# Patient Record
Sex: Female | Born: 1945
Health system: Southern US, Community
[De-identification: ages and names within clinical notes are randomized; demographics above are authoritative.]

---

## 2005-01-20 ENCOUNTER — Ambulatory Visit: Payer: Self-pay | Admitting: Pain Medicine

## 2005-01-26 ENCOUNTER — Ambulatory Visit: Payer: Self-pay | Admitting: Pain Medicine

## 2005-02-09 ENCOUNTER — Ambulatory Visit: Payer: Self-pay | Admitting: Physician Assistant

## 2005-12-01 ENCOUNTER — Ambulatory Visit: Payer: Self-pay | Admitting: Internal Medicine

## 2005-12-07 ENCOUNTER — Ambulatory Visit: Payer: Self-pay | Admitting: Internal Medicine

## 2005-12-10 ENCOUNTER — Ambulatory Visit: Payer: Self-pay | Admitting: Oncology

## 2005-12-30 ENCOUNTER — Ambulatory Visit: Payer: Self-pay | Admitting: Internal Medicine

## 2006-12-31 ENCOUNTER — Ambulatory Visit: Payer: Self-pay | Admitting: Oncology

## 2007-01-10 ENCOUNTER — Ambulatory Visit: Payer: Self-pay | Admitting: Internal Medicine

## 2007-01-12 ENCOUNTER — Ambulatory Visit: Payer: Self-pay | Admitting: Internal Medicine

## 2007-01-13 ENCOUNTER — Ambulatory Visit: Payer: Self-pay | Admitting: Oncology

## 2007-01-31 ENCOUNTER — Ambulatory Visit: Payer: Self-pay | Admitting: Oncology

## 2007-11-01 IMAGING — CT CT CHEST-ABD-PELV W/O CM
1 of 2 series · 14 of 29 positions shown, 19 images · non-contrast
Comparison: none

REASON FOR EXAM: F/U hx of cancer of chest, abd, pelv  NO IV CONTRAST due
to renal issues/Gumer...
COMMENTS:

PROCEDURE:     CT  - CT CHEST ABDOMEN AND PELVIS WO  - December 01, 2005  [DATE]
RESULT:
HISTORY: Malignancy.

[Series 2: soft tissue · axial · 0.91mm/px · z∈[+334,+874]mm · 14 of 122 slices shown, 19 images]
[im 7/122  mediastinal]
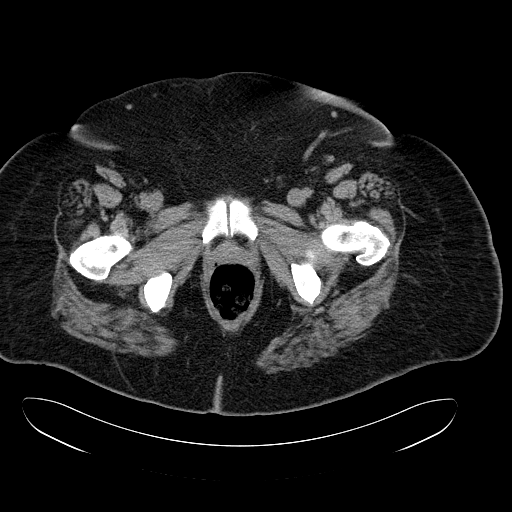
[im 7/122  bone]
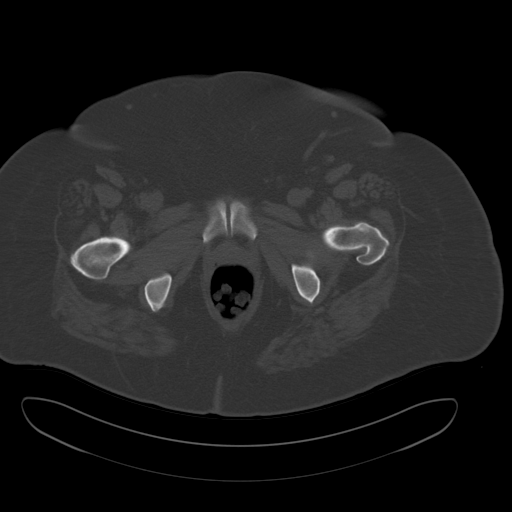
[im 21/122  mediastinal]
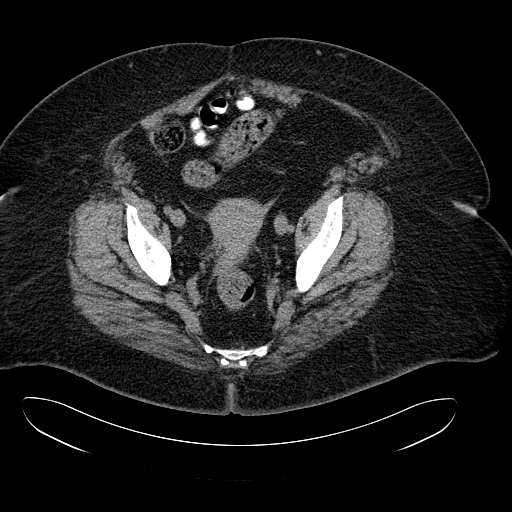
[im 27/122  mediastinal]
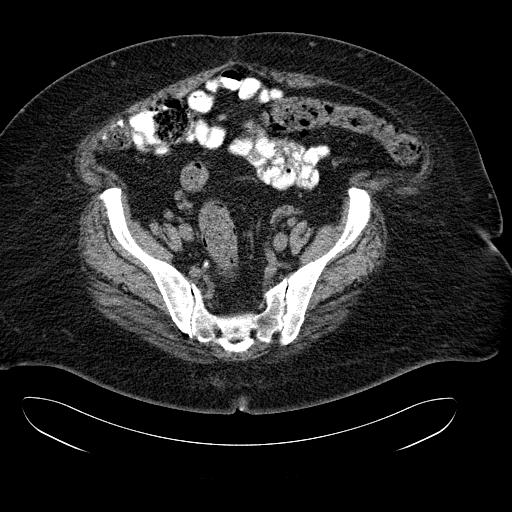
[im 34/122  mediastinal]
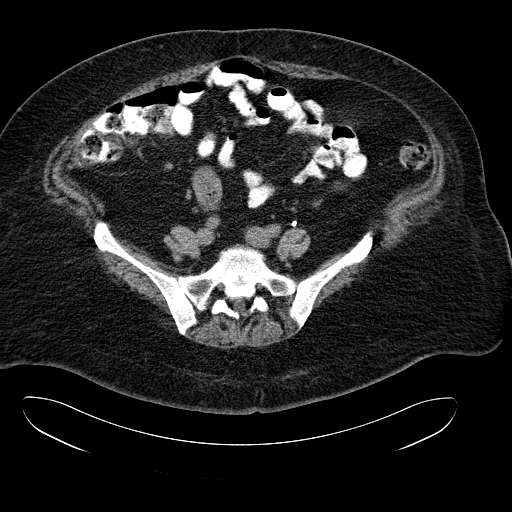
[im 48/122  mediastinal]
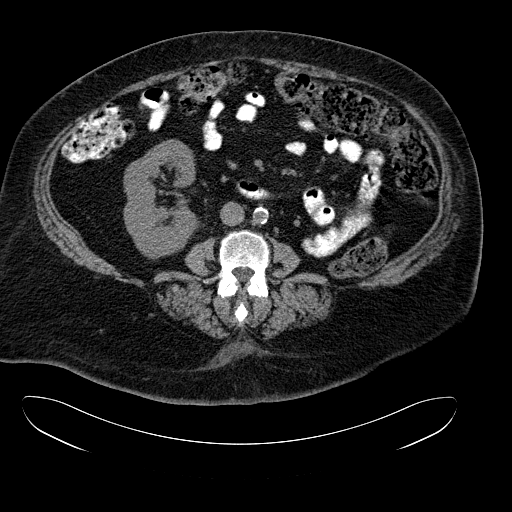
[im 54/122  mediastinal]
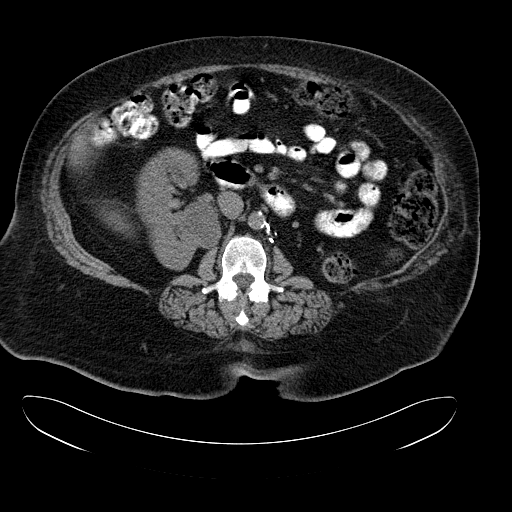
[im 61/122  mediastinal]
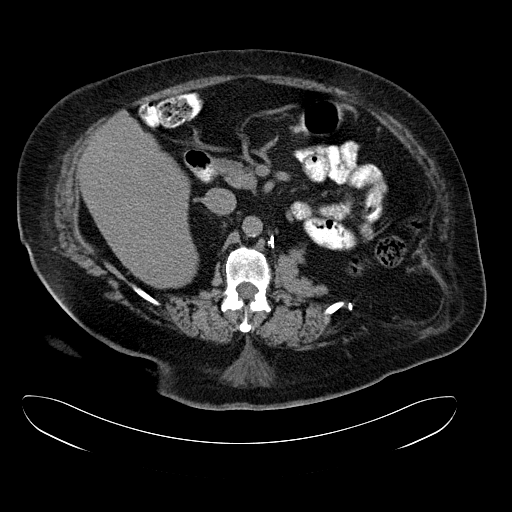
[im 68/122  mediastinal]
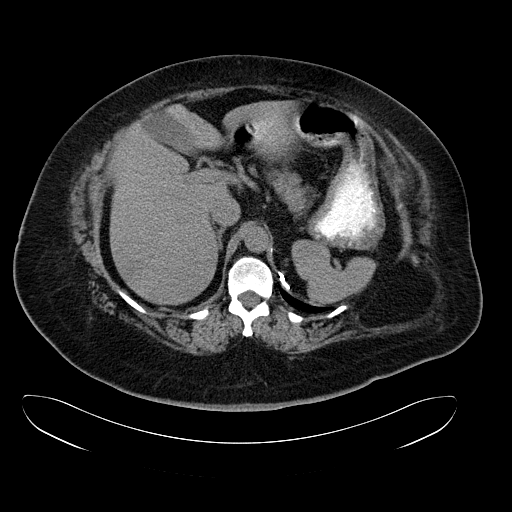
[im 74/122  mediastinal]
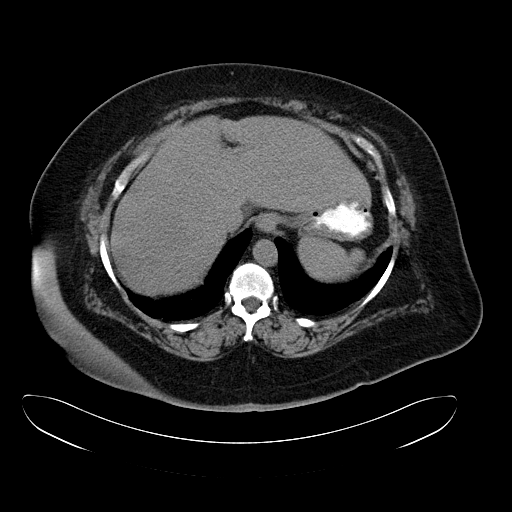
[im 74/122  bone]
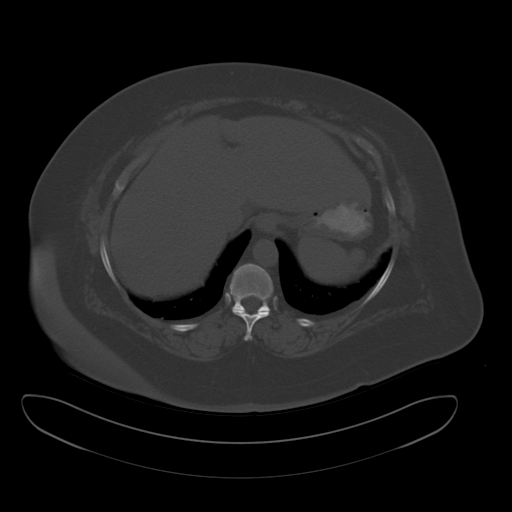
[im 88/122  mediastinal]
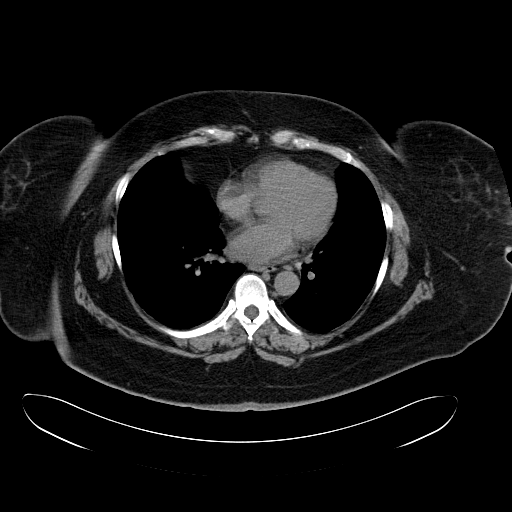
[im 95/122  mediastinal]
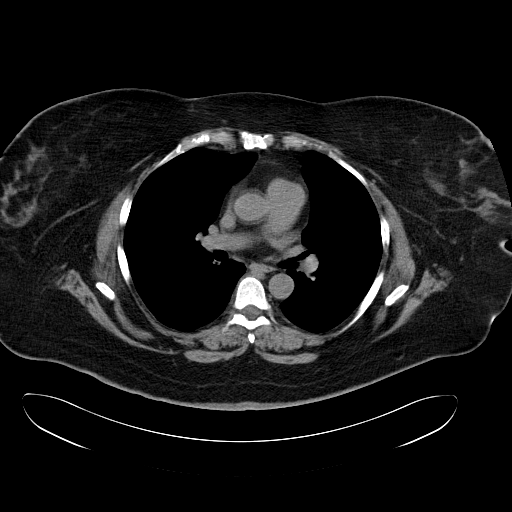
[im 95/122  lung]
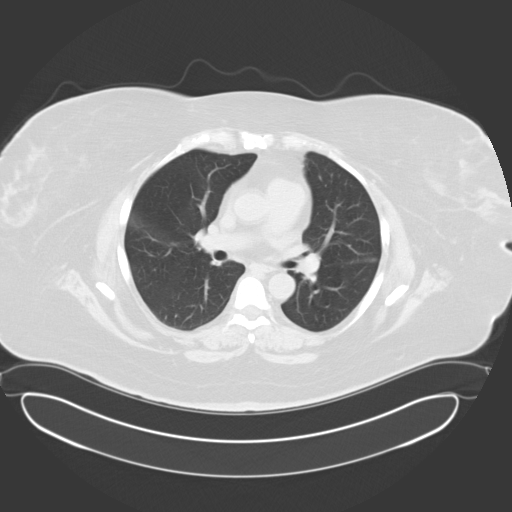
[im 101/122  mediastinal]
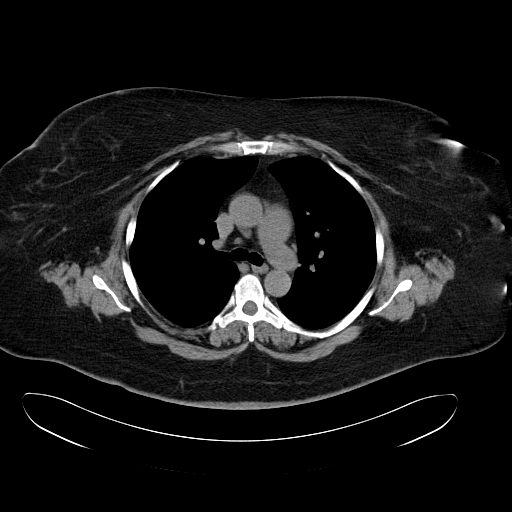
[im 101/122  lung]
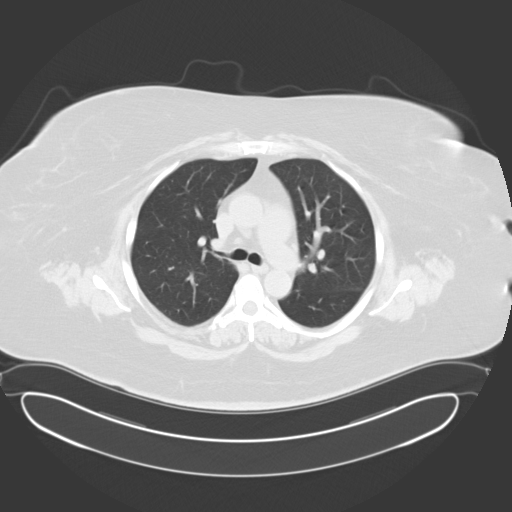
[im 108/122  lung]
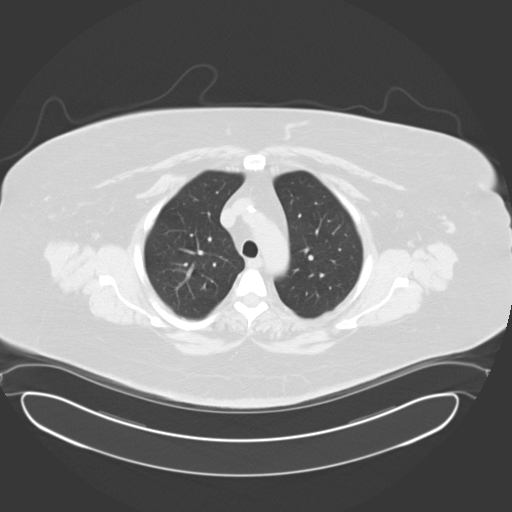
[im 115/122  mediastinal]
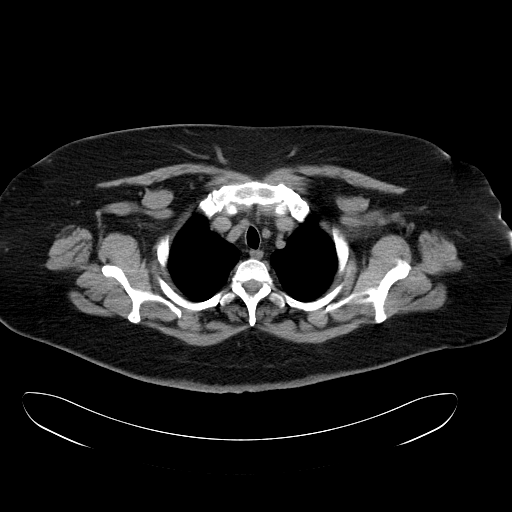
[im 115/122  lung]
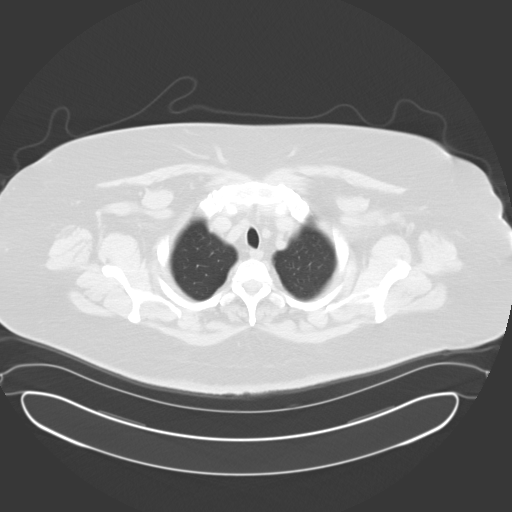

[14 of 29 positions shown; findings below may reference images not displayed]

COMPARISON STUDIES: None.

PROCEDURE AND FINDINGS: Standard non-enhanced CT of the chest, abdomen and
pelvis is obtained. The mediastinum is unremarkable.  Heart size is normal.
Coronary artery calcification is present. The chest wall is unremarkable.
The liver and spleen are normal. The gallbladder is non-distended. The
pancreas is normal. The LEFT kidney is absent. The patient appears to have
had a prior LEFT nephrectomy. The LEFT renal bed is unremarkable. No
evidence of mass lesion. The tail of the pancreas has fallen into the LEFT
renal bed. There is a 3.8 cm mass in the medial portion of the RIGHT kidney.
Although this could represent a cyst, CT attenuation numbers are in the low
20s.  A hypodense solid lesion cannot be excluded and renal ultrasound is
suggested. The RIGHT kidney has hypertrophied. Also bothersome is RIGHT
renal artery atherosclerotic vascular calcification. Given the solitary
kidney this needs to be closely followed.  The appendix is normal. The
pelvis is unremarkable. No inguinal adenopathy is noted.
IMPRESSION: 1)The patient has had a LEFT nephrectomy.

2)Large RIGHT renal mass in the medial portion of the RIGHT kidney. This is
most likely a cyst, however, Hounsfield units are in the low 20s and
therefore renal ultrasound will be necessary to exclude a solid hypodense
lesion.

3)Also worrisome is RIGHT renal artery atherosclerotic vascular
calcification. The patient should be followed carefully for renal vascular
disease given that this RIGHT kidney is a solitary kidney.

4)Coronary artery disease.

## 2008-01-15 ENCOUNTER — Ambulatory Visit: Payer: Self-pay | Admitting: Internal Medicine

## 2009-05-01 ENCOUNTER — Ambulatory Visit: Payer: Self-pay | Admitting: Internal Medicine

## 2010-12-08 ENCOUNTER — Ambulatory Visit: Payer: Self-pay | Admitting: Internal Medicine

## 2012-04-19 ENCOUNTER — Ambulatory Visit: Payer: Self-pay | Admitting: Internal Medicine

## 2012-04-21 ENCOUNTER — Ambulatory Visit: Payer: Self-pay | Admitting: Internal Medicine

## 2013-04-03 ENCOUNTER — Emergency Department: Payer: Self-pay | Admitting: Emergency Medicine

## 2013-04-03 LAB — URINALYSIS, COMPLETE
Bilirubin,UR: NEGATIVE
Ketone: NEGATIVE
Nitrite: NEGATIVE
Ph: 6 (ref 4.5–8.0)
Protein: 100
RBC,UR: 82 /HPF (ref 0–5)
Specific Gravity: 1.012 (ref 1.003–1.030)
Squamous Epithelial: 4

## 2013-10-20 ENCOUNTER — Ambulatory Visit: Payer: Self-pay | Admitting: Internal Medicine

## 2013-10-20 LAB — CBC WITH DIFFERENTIAL/PLATELET
Basophil #: 0.1 10*3/uL (ref 0.0–0.1)
Eosinophil #: 0.1 10*3/uL (ref 0.0–0.7)
Eosinophil %: 1.3 %
Lymphocyte #: 2.6 10*3/uL (ref 1.0–3.6)
MCH: 29 pg (ref 26.0–34.0)
Monocyte %: 5.5 %
Neutrophil #: 6.6 10*3/uL — ABNORMAL HIGH (ref 1.4–6.5)
Neutrophil %: 65.6 %
RBC: 4.61 10*6/uL (ref 3.80–5.20)
WBC: 10 10*3/uL (ref 3.6–11.0)

## 2013-10-20 LAB — COMPREHENSIVE METABOLIC PANEL
Albumin: 3.1 g/dL — ABNORMAL LOW (ref 3.4–5.0)
BUN: 20 mg/dL — ABNORMAL HIGH (ref 7–18)
Chloride: 105 mmol/L (ref 98–107)
Co2: 23 mmol/L (ref 21–32)
Creatinine: 1.53 mg/dL — ABNORMAL HIGH (ref 0.60–1.30)
EGFR (African American): 40 — ABNORMAL LOW
Osmolality: 273 (ref 275–301)
Potassium: 4.8 mmol/L (ref 3.5–5.1)
SGOT(AST): 24 U/L (ref 15–37)
SGPT (ALT): 19 U/L (ref 12–78)
Sodium: 135 mmol/L — ABNORMAL LOW (ref 136–145)

## 2013-10-20 LAB — URINALYSIS, COMPLETE
Bacteria: NONE SEEN
Blood: NEGATIVE
Glucose,UR: NEGATIVE mg/dL (ref 0–75)
Ketone: NEGATIVE
Leukocyte Esterase: NEGATIVE
Nitrite: NEGATIVE
Protein: NEGATIVE
RBC,UR: <1 /HPF (ref 0–5)
Squamous Epithelial: 3

## 2013-10-21 LAB — COMPREHENSIVE METABOLIC PANEL
Albumin: 3 g/dL — ABNORMAL LOW (ref 3.4–5.0)
Alkaline Phosphatase: 79 U/L
BUN: 21 mg/dL — ABNORMAL HIGH (ref 7–18)
Bilirubin,Total: 0.2 mg/dL (ref 0.2–1.0)
Chloride: 102 mmol/L (ref 98–107)
Creatinine: 2.14 mg/dL — ABNORMAL HIGH (ref 0.60–1.30)
EGFR (African American): 27 — ABNORMAL LOW
Glucose: 116 mg/dL — ABNORMAL HIGH (ref 65–99)
Potassium: 4.5 mmol/L (ref 3.5–5.1)
Sodium: 139 mmol/L (ref 136–145)
Total Protein: 6.6 g/dL (ref 6.4–8.2)

## 2013-10-22 ENCOUNTER — Inpatient Hospital Stay: Payer: Self-pay | Admitting: Internal Medicine

## 2013-10-22 LAB — BASIC METABOLIC PANEL
BUN: 24 mg/dL — ABNORMAL HIGH (ref 7–18)
Calcium, Total: 9.4 mg/dL (ref 8.5–10.1)
Chloride: 102 mmol/L (ref 98–107)
Glucose: 128 mg/dL — ABNORMAL HIGH (ref 65–99)
Sodium: 135 mmol/L — ABNORMAL LOW (ref 136–145)

## 2013-11-01 ENCOUNTER — Ambulatory Visit: Payer: Self-pay | Admitting: Internal Medicine

## 2013-12-23 ENCOUNTER — Emergency Department: Payer: Self-pay | Admitting: Internal Medicine

## 2013-12-23 LAB — CBC
HCT: 24.3 % — AB (ref 35.0–47.0)
HGB: 8.2 g/dL — ABNORMAL LOW (ref 12.0–16.0)
MCH: 31.2 pg (ref 26.0–34.0)
MCHC: 33.8 g/dL (ref 32.0–36.0)
MCV: 93 fL (ref 80–100)
PLATELETS: 382 10*3/uL (ref 150–440)
RBC: 2.63 10*6/uL — ABNORMAL LOW (ref 3.80–5.20)
RDW: 17.2 % — AB (ref 11.5–14.5)
WBC: 18.2 10*3/uL — ABNORMAL HIGH (ref 3.6–11.0)

## 2013-12-23 LAB — URINALYSIS, COMPLETE
BILIRUBIN, UR: NEGATIVE
GLUCOSE, UR: NEGATIVE mg/dL (ref 0–75)
Ketone: NEGATIVE
Nitrite: NEGATIVE
Ph: 8 (ref 4.5–8.0)
Protein: 100
Specific Gravity: 1.009 (ref 1.003–1.030)
Squamous Epithelial: NONE SEEN
WBC UR: 2996 /HPF (ref 0–5)

## 2013-12-23 LAB — COMPREHENSIVE METABOLIC PANEL
ALK PHOS: 85 U/L
ANION GAP: 6 — AB (ref 7–16)
AST: 33 U/L (ref 15–37)
Albumin: 2 g/dL — ABNORMAL LOW (ref 3.4–5.0)
BUN: 65 mg/dL — ABNORMAL HIGH (ref 7–18)
Bilirubin,Total: 0.3 mg/dL (ref 0.2–1.0)
CALCIUM: 8.3 mg/dL — AB (ref 8.5–10.1)
CREATININE: 5.28 mg/dL — AB (ref 0.60–1.30)
Chloride: 104 mmol/L (ref 98–107)
Co2: 25 mmol/L (ref 21–32)
EGFR (African American): 9 — ABNORMAL LOW
EGFR (Non-African Amer.): 8 — ABNORMAL LOW
Glucose: 93 mg/dL (ref 65–99)
OSMOLALITY: 288 (ref 275–301)
POTASSIUM: 4.9 mmol/L (ref 3.5–5.1)
SGPT (ALT): 21 U/L (ref 12–78)
Sodium: 135 mmol/L — ABNORMAL LOW (ref 136–145)
Total Protein: 5.4 g/dL — ABNORMAL LOW (ref 6.4–8.2)

## 2013-12-25 LAB — URINE CULTURE

## 2013-12-28 LAB — WOUND CULTURE

## 2013-12-28 LAB — CULTURE, BLOOD (SINGLE)

## 2014-01-13 ENCOUNTER — Inpatient Hospital Stay: Payer: Self-pay | Admitting: Internal Medicine

## 2014-01-13 LAB — CBC
HCT: 25.4 % — ABNORMAL LOW (ref 35.0–47.0)
HGB: 8.4 g/dL — ABNORMAL LOW (ref 12.0–16.0)
MCH: 31.4 pg (ref 26.0–34.0)
MCHC: 33.2 g/dL (ref 32.0–36.0)
MCV: 95 fL (ref 80–100)
Platelet: 227 10*3/uL (ref 150–440)
RBC: 2.68 10*6/uL — ABNORMAL LOW (ref 3.80–5.20)
RDW: 18.5 % — ABNORMAL HIGH (ref 11.5–14.5)
WBC: 8.4 10*3/uL (ref 3.6–11.0)

## 2014-01-13 LAB — COMPREHENSIVE METABOLIC PANEL
ALBUMIN: 2 g/dL — AB (ref 3.4–5.0)
Alkaline Phosphatase: 64 U/L
Anion Gap: 3 — ABNORMAL LOW (ref 7–16)
BUN: 13 mg/dL (ref 7–18)
Bilirubin,Total: 0.3 mg/dL (ref 0.2–1.0)
CALCIUM: 8.1 mg/dL — AB (ref 8.5–10.1)
Chloride: 99 mmol/L (ref 98–107)
Co2: 36 mmol/L — ABNORMAL HIGH (ref 21–32)
Creatinine: 2.89 mg/dL — ABNORMAL HIGH (ref 0.60–1.30)
EGFR (African American): 18 — ABNORMAL LOW
EGFR (Non-African Amer.): 16 — ABNORMAL LOW
Glucose: 83 mg/dL (ref 65–99)
Osmolality: 275 (ref 275–301)
Potassium: 3.1 mmol/L — ABNORMAL LOW (ref 3.5–5.1)
SGOT(AST): 19 U/L (ref 15–37)
SGPT (ALT): 20 U/L (ref 12–78)
Sodium: 138 mmol/L (ref 136–145)
Total Protein: 5.6 g/dL — ABNORMAL LOW (ref 6.4–8.2)

## 2014-01-13 LAB — TROPONIN I
TROPONIN-I: 0.18 ng/mL — AB
Troponin-I: 0.17 ng/mL — ABNORMAL HIGH
Troponin-I: 0.14 ng/mL — ABNORMAL HIGH

## 2014-01-13 LAB — PROTIME-INR
INR: 1.5
Prothrombin Time: 17.7 secs — ABNORMAL HIGH (ref 11.5–14.7)

## 2014-01-14 LAB — BASIC METABOLIC PANEL
ANION GAP: 5 — AB (ref 7–16)
BUN: 23 mg/dL — ABNORMAL HIGH (ref 7–18)
CALCIUM: 7.9 mg/dL — AB (ref 8.5–10.1)
CHLORIDE: 97 mmol/L — AB (ref 98–107)
Co2: 31 mmol/L (ref 21–32)
Creatinine: 3.82 mg/dL — ABNORMAL HIGH (ref 0.60–1.30)
EGFR (African American): 13 — ABNORMAL LOW
EGFR (Non-African Amer.): 11 — ABNORMAL LOW
Glucose: 140 mg/dL — ABNORMAL HIGH (ref 65–99)
Osmolality: 272 (ref 275–301)
POTASSIUM: 3.9 mmol/L (ref 3.5–5.1)
SODIUM: 133 mmol/L — AB (ref 136–145)

## 2014-01-14 LAB — CBC WITH DIFFERENTIAL/PLATELET
BASOS ABS: 0 10*3/uL (ref 0.0–0.1)
BASOS PCT: 0.5 %
EOS ABS: 0 10*3/uL (ref 0.0–0.7)
Eosinophil %: 0 %
HCT: 24 % — AB (ref 35.0–47.0)
HGB: 7.9 g/dL — ABNORMAL LOW (ref 12.0–16.0)
Lymphocyte #: 1.7 10*3/uL (ref 1.0–3.6)
Lymphocyte %: 26.5 %
MCH: 30.8 pg (ref 26.0–34.0)
MCHC: 32.8 g/dL (ref 32.0–36.0)
MCV: 94 fL (ref 80–100)
MONO ABS: 0.1 x10 3/mm — AB (ref 0.2–0.9)
Monocyte %: 2.3 %
Neutrophil #: 4.5 10*3/uL (ref 1.4–6.5)
Neutrophil %: 70.7 %
Platelet: 224 10*3/uL (ref 150–440)
RBC: 2.55 10*6/uL — AB (ref 3.80–5.20)
RDW: 17.4 % — ABNORMAL HIGH (ref 11.5–14.5)
WBC: 6.4 10*3/uL (ref 3.6–11.0)

## 2014-01-14 LAB — PROTIME-INR
INR: 1.6
PROTHROMBIN TIME: 18.5 s — AB (ref 11.5–14.7)

## 2014-01-15 LAB — CBC WITH DIFFERENTIAL/PLATELET
Basophil #: 0 10*3/uL (ref 0.0–0.1)
Basophil %: 0.2 %
EOS ABS: 0 10*3/uL (ref 0.0–0.7)
Eosinophil %: 0 %
HCT: 22.7 % — ABNORMAL LOW (ref 35.0–47.0)
HGB: 7.3 g/dL — ABNORMAL LOW (ref 12.0–16.0)
Lymphocyte #: 1.7 10*3/uL (ref 1.0–3.6)
Lymphocyte %: 16.3 %
MCH: 29.8 pg (ref 26.0–34.0)
MCHC: 32.1 g/dL (ref 32.0–36.0)
MCV: 93 fL (ref 80–100)
Monocyte #: 0.3 x10 3/mm (ref 0.2–0.9)
Monocyte %: 3.1 %
Neutrophil #: 8.4 10*3/uL — ABNORMAL HIGH (ref 1.4–6.5)
Neutrophil %: 80.4 %
Platelet: 247 10*3/uL (ref 150–440)
RBC: 2.44 10*6/uL — ABNORMAL LOW (ref 3.80–5.20)
RDW: 17.6 % — ABNORMAL HIGH (ref 11.5–14.5)
WBC: 10.4 10*3/uL (ref 3.6–11.0)

## 2014-01-15 LAB — URINALYSIS, COMPLETE
Bilirubin,UR: NEGATIVE
Glucose,UR: 50 mg/dL (ref 0–75)
KETONE: NEGATIVE
NITRITE: NEGATIVE
Ph: 8 (ref 4.5–8.0)
Protein: 100
RBC,UR: 102 /HPF (ref 0–5)
Specific Gravity: 1.005 (ref 1.003–1.030)
Squamous Epithelial: NONE SEEN
WBC UR: 334 /HPF (ref 0–5)

## 2014-01-15 LAB — PHOSPHORUS: Phosphorus: 4.9 mg/dL (ref 2.5–4.9)

## 2014-01-15 LAB — PROTIME-INR
INR: 2.4
Prothrombin Time: 25.9 secs — ABNORMAL HIGH (ref 11.5–14.7)

## 2014-01-15 LAB — BASIC METABOLIC PANEL
Anion Gap: 7 (ref 7–16)
BUN: 35 mg/dL — ABNORMAL HIGH (ref 7–18)
CREATININE: 4.42 mg/dL — AB (ref 0.60–1.30)
Calcium, Total: 7.9 mg/dL — ABNORMAL LOW (ref 8.5–10.1)
Chloride: 97 mmol/L — ABNORMAL LOW (ref 98–107)
Co2: 30 mmol/L (ref 21–32)
EGFR (Non-African Amer.): 10 — ABNORMAL LOW
GFR CALC AF AMER: 11 — AB
Glucose: 148 mg/dL — ABNORMAL HIGH (ref 65–99)
OSMOLALITY: 279 (ref 275–301)
Potassium: 3.7 mmol/L (ref 3.5–5.1)
SODIUM: 134 mmol/L — AB (ref 136–145)

## 2014-01-16 LAB — CBC WITH DIFFERENTIAL/PLATELET
Basophil #: 0 10*3/uL (ref 0.0–0.1)
Basophil %: 0.1 %
EOS PCT: 0 %
Eosinophil #: 0 10*3/uL (ref 0.0–0.7)
HCT: 23.1 % — ABNORMAL LOW (ref 35.0–47.0)
HGB: 7.6 g/dL — ABNORMAL LOW (ref 12.0–16.0)
Lymphocyte #: 1.5 10*3/uL (ref 1.0–3.6)
Lymphocyte %: 21.8 %
MCH: 30.9 pg (ref 26.0–34.0)
MCHC: 33 g/dL (ref 32.0–36.0)
MCV: 94 fL (ref 80–100)
Monocyte #: 0.4 x10 3/mm (ref 0.2–0.9)
Monocyte %: 5.9 %
Neutrophil #: 5 10*3/uL (ref 1.4–6.5)
Neutrophil %: 72.2 %
Platelet: 274 10*3/uL (ref 150–440)
RBC: 2.46 10*6/uL — ABNORMAL LOW (ref 3.80–5.20)
RDW: 17.8 % — AB (ref 11.5–14.5)
WBC: 6.9 10*3/uL (ref 3.6–11.0)

## 2014-01-16 LAB — BASIC METABOLIC PANEL
Anion Gap: 4 — ABNORMAL LOW (ref 7–16)
BUN: 18 mg/dL (ref 7–18)
Calcium, Total: 8 mg/dL — ABNORMAL LOW (ref 8.5–10.1)
Chloride: 99 mmol/L (ref 98–107)
Co2: 33 mmol/L — ABNORMAL HIGH (ref 21–32)
Creatinine: 2.37 mg/dL — ABNORMAL HIGH (ref 0.60–1.30)
EGFR (African American): 23 — ABNORMAL LOW
GFR CALC NON AF AMER: 20 — AB
Glucose: 114 mg/dL — ABNORMAL HIGH (ref 65–99)
Osmolality: 275 (ref 275–301)
POTASSIUM: 3.9 mmol/L (ref 3.5–5.1)
Sodium: 136 mmol/L (ref 136–145)

## 2014-01-16 LAB — PROTIME-INR
INR: 2.5
Prothrombin Time: 26.4 secs — ABNORMAL HIGH (ref 11.5–14.7)

## 2014-01-17 LAB — BASIC METABOLIC PANEL
ANION GAP: 5 — AB (ref 7–16)
BUN: 30 mg/dL — AB (ref 7–18)
CHLORIDE: 101 mmol/L (ref 98–107)
CO2: 32 mmol/L (ref 21–32)
CREATININE: 3.14 mg/dL — AB (ref 0.60–1.30)
Calcium, Total: 8.2 mg/dL — ABNORMAL LOW (ref 8.5–10.1)
EGFR (African American): 17 — ABNORMAL LOW
EGFR (Non-African Amer.): 14 — ABNORMAL LOW
Glucose: 101 mg/dL — ABNORMAL HIGH (ref 65–99)
Osmolality: 282 (ref 275–301)
POTASSIUM: 3.8 mmol/L (ref 3.5–5.1)
SODIUM: 138 mmol/L (ref 136–145)

## 2014-01-17 LAB — CBC WITH DIFFERENTIAL/PLATELET
BASOS PCT: 0.2 %
Basophil #: 0 10*3/uL (ref 0.0–0.1)
EOS ABS: 0 10*3/uL (ref 0.0–0.7)
EOS PCT: 0 %
HCT: 24.9 % — AB (ref 35.0–47.0)
HGB: 7.8 g/dL — ABNORMAL LOW (ref 12.0–16.0)
LYMPHS ABS: 2.3 10*3/uL (ref 1.0–3.6)
Lymphocyte %: 30 %
MCH: 29.3 pg (ref 26.0–34.0)
MCHC: 31.3 g/dL — ABNORMAL LOW (ref 32.0–36.0)
MCV: 94 fL (ref 80–100)
MONO ABS: 0.8 x10 3/mm (ref 0.2–0.9)
Monocyte %: 10.1 %
NEUTROS PCT: 59.7 %
Neutrophil #: 4.5 10*3/uL (ref 1.4–6.5)
Platelet: 317 10*3/uL (ref 150–440)
RBC: 2.66 10*6/uL — AB (ref 3.80–5.20)
RDW: 17.4 % — AB (ref 11.5–14.5)
WBC: 7.5 10*3/uL (ref 3.6–11.0)

## 2014-01-17 LAB — PHOSPHORUS: Phosphorus: 3.7 mg/dL (ref 2.5–4.9)

## 2014-01-17 LAB — PROTIME-INR
INR: 2.3
Prothrombin Time: 24.5 secs — ABNORMAL HIGH (ref 11.5–14.7)

## 2014-01-17 LAB — URINE CULTURE

## 2014-01-18 ENCOUNTER — Other Ambulatory Visit (HOSPITAL_COMMUNITY): Payer: Self-pay

## 2014-01-18 ENCOUNTER — Ambulatory Visit (HOSPITAL_COMMUNITY)
Admission: AD | Admit: 2014-01-18 | Discharge: 2014-01-18 | Disposition: A | Discharge status: Home / Self Care | Payer: Medicare Other | Source: Other Acute Inpatient Hospital | Attending: Internal Medicine | Admitting: Internal Medicine

## 2014-01-18 ENCOUNTER — Inpatient Hospital Stay
Admission: AD | Admit: 2014-01-18 | Discharge: 2014-02-06 | Disposition: A | Discharge status: Other Acute Inpatient Hospital | Source: Ambulatory Visit | Attending: Internal Medicine | Admitting: Internal Medicine

## 2014-01-18 DIAGNOSIS — A419 Sepsis, unspecified organism: Secondary | ICD-10-CM | POA: Insufficient documentation

## 2014-01-18 LAB — CULTURE, BLOOD (SINGLE)

## 2014-01-18 LAB — PROTIME-INR
INR: 2.3
Prothrombin Time: 24.7 secs — ABNORMAL HIGH (ref 11.5–14.7)

## 2014-01-19 ENCOUNTER — Institutional Professional Consult (permissible substitution) (HOSPITAL_COMMUNITY)

## 2014-01-19 LAB — T4, FREE: FREE T4: 1.75 ng/dL (ref 0.80–1.80)

## 2014-01-19 LAB — URINALYSIS, ROUTINE W REFLEX MICROSCOPIC
Bilirubin Urine: NEGATIVE
Glucose, UA: NEGATIVE mg/dL
Ketones, ur: NEGATIVE mg/dL
NITRITE: NEGATIVE
PH: 7.5 (ref 5.0–8.0)
Protein, ur: >300 mg/dL — AB
SPECIFIC GRAVITY, URINE: 1.012 (ref 1.005–1.030)
UROBILINOGEN UA: 0.2 mg/dL (ref 0.0–1.0)

## 2014-01-19 LAB — PROTIME-INR
INR: 1.96 — ABNORMAL HIGH (ref 0.00–1.49)
Prothrombin Time: 21.7 seconds — ABNORMAL HIGH (ref 11.6–15.2)

## 2014-01-19 LAB — CBC WITH DIFFERENTIAL/PLATELET
BASOS ABS: 0 10*3/uL (ref 0.0–0.1)
Basophils Relative: 0 % (ref 0–1)
Eosinophils Absolute: 0.2 10*3/uL (ref 0.0–0.7)
Eosinophils Relative: 2 % (ref 0–5)
HEMATOCRIT: 26.5 % — AB (ref 36.0–46.0)
Hemoglobin: 8.4 g/dL — ABNORMAL LOW (ref 12.0–15.0)
LYMPHS PCT: 37 % (ref 12–46)
Lymphs Abs: 3.5 10*3/uL (ref 0.7–4.0)
MCH: 30.5 pg (ref 26.0–34.0)
MCHC: 31.7 g/dL (ref 30.0–36.0)
MCV: 96.4 fL (ref 78.0–100.0)
MONO ABS: 0.6 10*3/uL (ref 0.1–1.0)
Monocytes Relative: 6 % (ref 3–12)
NEUTROS ABS: 5.2 10*3/uL (ref 1.7–7.7)
NEUTROS PCT: 55 % (ref 43–77)
Platelets: 331 10*3/uL (ref 150–400)
RBC: 2.75 MIL/uL — ABNORMAL LOW (ref 3.87–5.11)
RDW: 17.3 % — AB (ref 11.5–15.5)
WBC: 9.5 10*3/uL (ref 4.0–10.5)

## 2014-01-19 LAB — COMPREHENSIVE METABOLIC PANEL
ALK PHOS: 58 U/L (ref 39–117)
ALT: 16 U/L (ref 0–35)
AST: 17 U/L (ref 0–37)
Albumin: 2.1 g/dL — ABNORMAL LOW (ref 3.5–5.2)
BUN: 31 mg/dL — ABNORMAL HIGH (ref 6–23)
CHLORIDE: 98 meq/L (ref 96–112)
CO2: 33 meq/L — AB (ref 19–32)
CREATININE: 3.3 mg/dL — AB (ref 0.50–1.10)
Calcium: 8.7 mg/dL (ref 8.4–10.5)
GFR calc Af Amer: 15 mL/min — ABNORMAL LOW (ref >90)
GFR, EST NON AFRICAN AMERICAN: 13 mL/min — AB (ref >90)
Glucose, Bld: 106 mg/dL — ABNORMAL HIGH (ref 70–99)
POTASSIUM: 3.9 meq/L (ref 3.7–5.3)
Sodium: 142 mEq/L (ref 137–147)
Total Protein: 5.1 g/dL — ABNORMAL LOW (ref 6.0–8.3)

## 2014-01-19 LAB — URINE MICROSCOPIC-ADD ON

## 2014-01-19 LAB — VANCOMYCIN, RANDOM: VANCOMYCIN RM: 11.4 ug/mL

## 2014-01-19 LAB — MAGNESIUM: Magnesium: 1.5 mg/dL (ref 1.5–2.5)

## 2014-01-19 LAB — HEPATITIS B SURFACE ANTIBODY,QUALITATIVE: Hep B S Ab: NEGATIVE

## 2014-01-19 LAB — TSH: TSH: 3.813 u[IU]/mL (ref 0.350–4.500)

## 2014-01-19 LAB — VITAMIN B12: Vitamin B-12: 327 pg/mL (ref 211–911)

## 2014-01-19 LAB — PHOSPHORUS: PHOSPHORUS: 4 mg/dL (ref 2.3–4.6)

## 2014-01-19 LAB — PREALBUMIN: Prealbumin: 26.1 mg/dL (ref 17.0–34.0)

## 2014-01-19 LAB — HEPATITIS B SURFACE ANTIGEN: HEP B S AG: NEGATIVE

## 2014-01-20 LAB — CBC
HCT: 25.3 % — ABNORMAL LOW (ref 36.0–46.0)
Hemoglobin: 8.1 g/dL — ABNORMAL LOW (ref 12.0–15.0)
MCH: 30.5 pg (ref 26.0–34.0)
MCHC: 32 g/dL (ref 30.0–36.0)
MCV: 95.1 fL (ref 78.0–100.0)
Platelets: 311 10*3/uL (ref 150–400)
RBC: 2.66 MIL/uL — ABNORMAL LOW (ref 3.87–5.11)
RDW: 17.2 % — AB (ref 11.5–15.5)
WBC: 9.7 10*3/uL (ref 4.0–10.5)

## 2014-01-20 LAB — BASIC METABOLIC PANEL
BUN: 43 mg/dL — AB (ref 6–23)
CHLORIDE: 98 meq/L (ref 96–112)
CO2: 28 mEq/L (ref 19–32)
Calcium: 8.8 mg/dL (ref 8.4–10.5)
Creatinine, Ser: 4.09 mg/dL — ABNORMAL HIGH (ref 0.50–1.10)
GFR, EST AFRICAN AMERICAN: 12 mL/min — AB (ref >90)
GFR, EST NON AFRICAN AMERICAN: 10 mL/min — AB (ref >90)
Glucose, Bld: 145 mg/dL — ABNORMAL HIGH (ref 70–99)
Potassium: 5.6 mEq/L — ABNORMAL HIGH (ref 3.7–5.3)
Sodium: 139 mEq/L (ref 137–147)

## 2014-01-20 LAB — PROTIME-INR
INR: 1.85 — ABNORMAL HIGH (ref 0.00–1.49)
Prothrombin Time: 20.8 seconds — ABNORMAL HIGH (ref 11.6–15.2)

## 2014-01-20 LAB — URINE CULTURE
COLONY COUNT: NO GROWTH
Culture: NO GROWTH

## 2014-01-20 LAB — FOLATE RBC: RBC FOLATE: 1008 ng/mL — AB (ref >280)

## 2014-01-21 LAB — COMPREHENSIVE METABOLIC PANEL
ALK PHOS: 54 U/L (ref 39–117)
ALT: 16 U/L (ref 0–35)
AST: 16 U/L (ref 0–37)
Albumin: 2.1 g/dL — ABNORMAL LOW (ref 3.5–5.2)
BUN: 65 mg/dL — AB (ref 6–23)
CO2: 28 mEq/L (ref 19–32)
Calcium: 8.8 mg/dL (ref 8.4–10.5)
Chloride: 98 mEq/L (ref 96–112)
Creatinine, Ser: 4.8 mg/dL — ABNORMAL HIGH (ref 0.50–1.10)
GFR calc Af Amer: 10 mL/min — ABNORMAL LOW (ref >90)
GFR calc non Af Amer: 8 mL/min — ABNORMAL LOW (ref >90)
GLUCOSE: 191 mg/dL — AB (ref 70–99)
POTASSIUM: 5.5 meq/L — AB (ref 3.7–5.3)
Sodium: 138 mEq/L (ref 137–147)
TOTAL PROTEIN: 5.3 g/dL — AB (ref 6.0–8.3)

## 2014-01-21 LAB — CULTURE, BLOOD (ROUTINE X 2)

## 2014-01-21 LAB — CBC WITH DIFFERENTIAL/PLATELET
BASOS ABS: 0 10*3/uL (ref 0.0–0.1)
Basophils Relative: 0 % (ref 0–1)
Eosinophils Absolute: 0 10*3/uL (ref 0.0–0.7)
Eosinophils Relative: 0 % (ref 0–5)
HEMATOCRIT: 24.8 % — AB (ref 36.0–46.0)
Hemoglobin: 8.1 g/dL — ABNORMAL LOW (ref 12.0–15.0)
LYMPHS PCT: 13 % (ref 12–46)
Lymphs Abs: 2.2 10*3/uL (ref 0.7–4.0)
MCH: 30.9 pg (ref 26.0–34.0)
MCHC: 32.7 g/dL (ref 30.0–36.0)
MCV: 94.7 fL (ref 78.0–100.0)
Monocytes Absolute: 0.4 10*3/uL (ref 0.1–1.0)
Monocytes Relative: 3 % (ref 3–12)
Neutro Abs: 14 10*3/uL — ABNORMAL HIGH (ref 1.7–7.7)
Neutrophils Relative %: 84 % — ABNORMAL HIGH (ref 43–77)
PLATELETS: 315 10*3/uL (ref 150–400)
RBC: 2.62 MIL/uL — ABNORMAL LOW (ref 3.87–5.11)
RDW: 16.6 % — AB (ref 11.5–15.5)
WBC: 16.6 10*3/uL — AB (ref 4.0–10.5)

## 2014-01-21 LAB — PROTIME-INR
INR: 1.87 — ABNORMAL HIGH (ref 0.00–1.49)
Prothrombin Time: 21 seconds — ABNORMAL HIGH (ref 11.6–15.2)

## 2014-01-22 LAB — BASIC METABOLIC PANEL
BUN: 32 mg/dL — ABNORMAL HIGH (ref 6–23)
CALCIUM: 8.5 mg/dL (ref 8.4–10.5)
CO2: 29 meq/L (ref 19–32)
CREATININE: 2.51 mg/dL — AB (ref 0.50–1.10)
Chloride: 98 mEq/L (ref 96–112)
GFR calc Af Amer: 21 mL/min — ABNORMAL LOW (ref >90)
GFR calc non Af Amer: 18 mL/min — ABNORMAL LOW (ref >90)
Glucose, Bld: 150 mg/dL — ABNORMAL HIGH (ref 70–99)
Potassium: 5.1 mEq/L (ref 3.7–5.3)
SODIUM: 139 meq/L (ref 137–147)

## 2014-01-22 LAB — CBC WITH DIFFERENTIAL/PLATELET
BASOS ABS: 0 10*3/uL (ref 0.0–0.1)
Basophils Relative: 0 % (ref 0–1)
Eosinophils Absolute: 0 10*3/uL (ref 0.0–0.7)
Eosinophils Relative: 0 % (ref 0–5)
HCT: 24.6 % — ABNORMAL LOW (ref 36.0–46.0)
Hemoglobin: 8.1 g/dL — ABNORMAL LOW (ref 12.0–15.0)
LYMPHS ABS: 1.5 10*3/uL (ref 0.7–4.0)
LYMPHS PCT: 11 % — AB (ref 12–46)
MCH: 30.6 pg (ref 26.0–34.0)
MCHC: 32.9 g/dL (ref 30.0–36.0)
MCV: 92.8 fL (ref 78.0–100.0)
Monocytes Absolute: 0.4 10*3/uL (ref 0.1–1.0)
Monocytes Relative: 3 % (ref 3–12)
NEUTROS PCT: 87 % — AB (ref 43–77)
Neutro Abs: 12.5 10*3/uL — ABNORMAL HIGH (ref 1.7–7.7)
PLATELETS: 313 10*3/uL (ref 150–400)
RBC: 2.65 MIL/uL — ABNORMAL LOW (ref 3.87–5.11)
RDW: 16.6 % — ABNORMAL HIGH (ref 11.5–15.5)
WBC: 14.4 10*3/uL — AB (ref 4.0–10.5)

## 2014-01-22 LAB — PROCALCITONIN: Procalcitonin: 0.12 ng/mL

## 2014-01-22 LAB — PROTIME-INR
INR: 2.2 — ABNORMAL HIGH (ref 0.00–1.49)
PROTHROMBIN TIME: 23.7 s — AB (ref 11.6–15.2)

## 2014-01-23 DIAGNOSIS — I369 Nonrheumatic tricuspid valve disorder, unspecified: Secondary | ICD-10-CM

## 2014-01-23 LAB — RENAL FUNCTION PANEL
Albumin: 2.1 g/dL — ABNORMAL LOW (ref 3.5–5.2)
BUN: 68 mg/dL — ABNORMAL HIGH (ref 6–23)
CALCIUM: 8.4 mg/dL (ref 8.4–10.5)
CHLORIDE: 96 meq/L (ref 96–112)
CO2: 27 meq/L (ref 19–32)
CREATININE: 3.49 mg/dL — AB (ref 0.50–1.10)
GFR calc Af Amer: 14 mL/min — ABNORMAL LOW (ref >90)
GFR, EST NON AFRICAN AMERICAN: 12 mL/min — AB (ref >90)
GLUCOSE: 162 mg/dL — AB (ref 70–99)
Phosphorus: 6.6 mg/dL — ABNORMAL HIGH (ref 2.3–4.6)
Potassium: 5.2 mEq/L (ref 3.7–5.3)
Sodium: 137 mEq/L (ref 137–147)

## 2014-01-23 LAB — PROTIME-INR
INR: 2.89 — ABNORMAL HIGH (ref 0.00–1.49)
PROTHROMBIN TIME: 29.2 s — AB (ref 11.6–15.2)

## 2014-01-23 LAB — CBC
HCT: 24.9 % — ABNORMAL LOW (ref 36.0–46.0)
Hemoglobin: 8 g/dL — ABNORMAL LOW (ref 12.0–15.0)
MCH: 29.7 pg (ref 26.0–34.0)
MCHC: 32.1 g/dL (ref 30.0–36.0)
MCV: 92.6 fL (ref 78.0–100.0)
Platelets: 332 10*3/uL (ref 150–400)
RBC: 2.69 MIL/uL — AB (ref 3.87–5.11)
RDW: 16.4 % — ABNORMAL HIGH (ref 11.5–15.5)
WBC: 14 10*3/uL — ABNORMAL HIGH (ref 4.0–10.5)

## 2014-01-23 NOTE — Progress Notes (Signed)
Echocardiogram 2D Echocardiogram has been performed.  [name] 01/23/2014, 12:23 PM

## 2014-01-24 LAB — BASIC METABOLIC PANEL
BUN: 51 mg/dL — ABNORMAL HIGH (ref 6–23)
CO2: 28 mEq/L (ref 19–32)
Calcium: 8.5 mg/dL (ref 8.4–10.5)
Chloride: 94 mEq/L — ABNORMAL LOW (ref 96–112)
Creatinine, Ser: 2.61 mg/dL — ABNORMAL HIGH (ref 0.50–1.10)
GFR calc Af Amer: 20 mL/min — ABNORMAL LOW (ref >90)
GFR, EST NON AFRICAN AMERICAN: 18 mL/min — AB (ref >90)
Glucose, Bld: 141 mg/dL — ABNORMAL HIGH (ref 70–99)
POTASSIUM: 5.5 meq/L — AB (ref 3.7–5.3)
SODIUM: 137 meq/L (ref 137–147)

## 2014-01-24 LAB — PROTIME-INR
INR: 3.46 — AB (ref 0.00–1.49)
Prothrombin Time: 33.5 seconds — ABNORMAL HIGH (ref 11.6–15.2)

## 2014-01-24 LAB — PTH, INTACT AND CALCIUM
CALCIUM TOTAL (PTH): 7.8 mg/dL — AB (ref 8.4–10.5)
PTH: 243.6 pg/mL — ABNORMAL HIGH (ref 14.0–72.0)

## 2014-01-25 ENCOUNTER — Other Ambulatory Visit (HOSPITAL_COMMUNITY)

## 2014-01-25 LAB — CBC WITH DIFFERENTIAL/PLATELET
Basophils Absolute: 0 10*3/uL (ref 0.0–0.1)
Basophils Relative: 0 % (ref 0–1)
EOS ABS: 0 10*3/uL (ref 0.0–0.7)
Eosinophils Relative: 0 % (ref 0–5)
HCT: 25.9 % — ABNORMAL LOW (ref 36.0–46.0)
Hemoglobin: 8.4 g/dL — ABNORMAL LOW (ref 12.0–15.0)
Lymphocytes Relative: 18 % (ref 12–46)
Lymphs Abs: 3.6 10*3/uL (ref 0.7–4.0)
MCH: 30.3 pg (ref 26.0–34.0)
MCHC: 32.4 g/dL (ref 30.0–36.0)
MCV: 93.5 fL (ref 78.0–100.0)
MONOS PCT: 7 % (ref 3–12)
Monocytes Absolute: 1.4 10*3/uL — ABNORMAL HIGH (ref 0.1–1.0)
Neutro Abs: 14.6 10*3/uL — ABNORMAL HIGH (ref 1.7–7.7)
Neutrophils Relative %: 74 % (ref 43–77)
PLATELETS: 307 10*3/uL (ref 150–400)
RBC: 2.77 MIL/uL — ABNORMAL LOW (ref 3.87–5.11)
RDW: 16.6 % — AB (ref 11.5–15.5)
WBC: 19.7 10*3/uL — ABNORMAL HIGH (ref 4.0–10.5)

## 2014-01-25 LAB — CULTURE, BLOOD (ROUTINE X 2)
Culture: NO GROWTH
Culture: NO GROWTH

## 2014-01-25 LAB — RENAL FUNCTION PANEL
ALBUMIN: 2.1 g/dL — AB (ref 3.5–5.2)
BUN: 73 mg/dL — ABNORMAL HIGH (ref 6–23)
CALCIUM: 8.5 mg/dL (ref 8.4–10.5)
CO2: 29 mEq/L (ref 19–32)
CREATININE: 3.47 mg/dL — AB (ref 0.50–1.10)
Chloride: 95 mEq/L — ABNORMAL LOW (ref 96–112)
GFR calc Af Amer: 14 mL/min — ABNORMAL LOW (ref >90)
GFR, EST NON AFRICAN AMERICAN: 12 mL/min — AB (ref >90)
Glucose, Bld: 67 mg/dL — ABNORMAL LOW (ref 70–99)
PHOSPHORUS: 6.4 mg/dL — AB (ref 2.3–4.6)
Potassium: 5.4 mEq/L — ABNORMAL HIGH (ref 3.7–5.3)
SODIUM: 137 meq/L (ref 137–147)

## 2014-01-25 LAB — PROTIME-INR
INR: 3.35 — AB (ref 0.00–1.49)
PROTHROMBIN TIME: 32.7 s — AB (ref 11.6–15.2)

## 2014-01-26 ENCOUNTER — Other Ambulatory Visit (HOSPITAL_COMMUNITY)

## 2014-01-26 LAB — CBC WITH DIFFERENTIAL/PLATELET
Basophils Absolute: 0 10*3/uL (ref 0.0–0.1)
Basophils Relative: 0 % (ref 0–1)
Eosinophils Absolute: 0 10*3/uL (ref 0.0–0.7)
Eosinophils Relative: 0 % (ref 0–5)
HEMATOCRIT: 24.6 % — AB (ref 36.0–46.0)
HEMOGLOBIN: 8 g/dL — AB (ref 12.0–15.0)
LYMPHS PCT: 14 % (ref 12–46)
Lymphs Abs: 2.4 10*3/uL (ref 0.7–4.0)
MCH: 30.3 pg (ref 26.0–34.0)
MCHC: 32.5 g/dL (ref 30.0–36.0)
MCV: 93.2 fL (ref 78.0–100.0)
MONO ABS: 0.7 10*3/uL (ref 0.1–1.0)
MONOS PCT: 4 % (ref 3–12)
Neutro Abs: 13.8 10*3/uL — ABNORMAL HIGH (ref 1.7–7.7)
Neutrophils Relative %: 82 % — ABNORMAL HIGH (ref 43–77)
Platelets: 290 10*3/uL (ref 150–400)
RBC: 2.64 MIL/uL — AB (ref 3.87–5.11)
RDW: 16.6 % — ABNORMAL HIGH (ref 11.5–15.5)
WBC: 16.9 10*3/uL — AB (ref 4.0–10.5)

## 2014-01-26 LAB — URINE CULTURE
Colony Count: NO GROWTH
Culture: NO GROWTH

## 2014-01-26 LAB — BASIC METABOLIC PANEL
BUN: 41 mg/dL — ABNORMAL HIGH (ref 6–23)
CO2: 29 meq/L (ref 19–32)
Calcium: 8.3 mg/dL — ABNORMAL LOW (ref 8.4–10.5)
Chloride: 99 mEq/L (ref 96–112)
Creatinine, Ser: 2.41 mg/dL — ABNORMAL HIGH (ref 0.50–1.10)
GFR calc Af Amer: 22 mL/min — ABNORMAL LOW (ref >90)
GFR, EST NON AFRICAN AMERICAN: 19 mL/min — AB (ref >90)
GLUCOSE: 130 mg/dL — AB (ref 70–99)
Potassium: 5.2 mEq/L (ref 3.7–5.3)
SODIUM: 139 meq/L (ref 137–147)

## 2014-01-26 LAB — PHOSPHORUS: Phosphorus: 4.9 mg/dL — ABNORMAL HIGH (ref 2.3–4.6)

## 2014-01-26 LAB — PROTIME-INR
INR: 1.87 — ABNORMAL HIGH (ref 0.00–1.49)
PROTHROMBIN TIME: 21 s — AB (ref 11.6–15.2)

## 2014-01-26 LAB — MAGNESIUM: Magnesium: 2 mg/dL (ref 1.5–2.5)

## 2014-01-27 LAB — CBC WITH DIFFERENTIAL/PLATELET
BASOS PCT: 0 % (ref 0–1)
Basophils Absolute: 0 10*3/uL (ref 0.0–0.1)
Eosinophils Absolute: 0 10*3/uL (ref 0.0–0.7)
Eosinophils Relative: 0 % (ref 0–5)
HCT: 25.3 % — ABNORMAL LOW (ref 36.0–46.0)
Hemoglobin: 8.4 g/dL — ABNORMAL LOW (ref 12.0–15.0)
LYMPHS PCT: 16 % (ref 12–46)
Lymphs Abs: 2.7 10*3/uL (ref 0.7–4.0)
MCH: 30.9 pg (ref 26.0–34.0)
MCHC: 33.2 g/dL (ref 30.0–36.0)
MCV: 93 fL (ref 78.0–100.0)
MONOS PCT: 4 % (ref 3–12)
Monocytes Absolute: 0.7 10*3/uL (ref 0.1–1.0)
NEUTROS PCT: 80 % — AB (ref 43–77)
Neutro Abs: 13.7 10*3/uL — ABNORMAL HIGH (ref 1.7–7.7)
PLATELETS: 292 10*3/uL (ref 150–400)
RBC: 2.72 MIL/uL — ABNORMAL LOW (ref 3.87–5.11)
RDW: 16.7 % — ABNORMAL HIGH (ref 11.5–15.5)
WBC: 17.1 10*3/uL — AB (ref 4.0–10.5)

## 2014-01-27 LAB — BASIC METABOLIC PANEL
BUN: 71 mg/dL — ABNORMAL HIGH (ref 6–23)
CALCIUM: 8.6 mg/dL (ref 8.4–10.5)
CO2: 26 mEq/L (ref 19–32)
Chloride: 95 mEq/L — ABNORMAL LOW (ref 96–112)
Creatinine, Ser: 3.31 mg/dL — ABNORMAL HIGH (ref 0.50–1.10)
GFR calc Af Amer: 15 mL/min — ABNORMAL LOW (ref >90)
GFR, EST NON AFRICAN AMERICAN: 13 mL/min — AB (ref >90)
Glucose, Bld: 126 mg/dL — ABNORMAL HIGH (ref 70–99)
Potassium: 6 mEq/L — ABNORMAL HIGH (ref 3.7–5.3)
SODIUM: 135 meq/L — AB (ref 137–147)

## 2014-01-27 LAB — PROTIME-INR
INR: 1.48 (ref 0.00–1.49)
Prothrombin Time: 17.5 seconds — ABNORMAL HIGH (ref 11.6–15.2)

## 2014-01-28 LAB — CBC WITH DIFFERENTIAL/PLATELET
Basophils Absolute: 0 10*3/uL (ref 0.0–0.1)
Basophils Relative: 0 % (ref 0–1)
EOS PCT: 0 % (ref 0–5)
Eosinophils Absolute: 0 10*3/uL (ref 0.0–0.7)
HCT: 25.3 % — ABNORMAL LOW (ref 36.0–46.0)
HEMOGLOBIN: 8.3 g/dL — AB (ref 12.0–15.0)
LYMPHS PCT: 13 % (ref 12–46)
Lymphs Abs: 1.9 10*3/uL (ref 0.7–4.0)
MCH: 29.9 pg (ref 26.0–34.0)
MCHC: 32.8 g/dL (ref 30.0–36.0)
MCV: 91 fL (ref 78.0–100.0)
MONO ABS: 0.4 10*3/uL (ref 0.1–1.0)
Monocytes Relative: 3 % (ref 3–12)
NEUTROS PCT: 84 % — AB (ref 43–77)
Neutro Abs: 12.4 10*3/uL — ABNORMAL HIGH (ref 1.7–7.7)
Platelets: 283 10*3/uL (ref 150–400)
RBC: 2.78 MIL/uL — AB (ref 3.87–5.11)
RDW: 16.6 % — ABNORMAL HIGH (ref 11.5–15.5)
WBC: 14.7 10*3/uL — AB (ref 4.0–10.5)

## 2014-01-28 LAB — COMPREHENSIVE METABOLIC PANEL
ALT: 33 U/L (ref 0–35)
AST: 16 U/L (ref 0–37)
Albumin: 2.4 g/dL — ABNORMAL LOW (ref 3.5–5.2)
Alkaline Phosphatase: 48 U/L (ref 39–117)
BUN: 96 mg/dL — ABNORMAL HIGH (ref 6–23)
CALCIUM: 8.8 mg/dL (ref 8.4–10.5)
CO2: 25 mEq/L (ref 19–32)
CREATININE: 4.06 mg/dL — AB (ref 0.50–1.10)
Chloride: 96 mEq/L (ref 96–112)
GFR, EST AFRICAN AMERICAN: 12 mL/min — AB (ref >90)
GFR, EST NON AFRICAN AMERICAN: 10 mL/min — AB (ref >90)
GLUCOSE: 144 mg/dL — AB (ref 70–99)
Potassium: 6.4 mEq/L — ABNORMAL HIGH (ref 3.7–5.3)
Sodium: 138 mEq/L (ref 137–147)
Total Bilirubin: <0.2 mg/dL — ABNORMAL LOW (ref 0.3–1.2)
Total Protein: 5.4 g/dL — ABNORMAL LOW (ref 6.0–8.3)

## 2014-01-28 LAB — POTASSIUM: Potassium: 4.2 mEq/L (ref 3.7–5.3)

## 2014-01-28 LAB — PHOSPHORUS: Phosphorus: 7.5 mg/dL — ABNORMAL HIGH (ref 2.3–4.6)

## 2014-01-28 LAB — PROTIME-INR
INR: 1.25 (ref 0.00–1.49)
PROTHROMBIN TIME: 15.4 s — AB (ref 11.6–15.2)

## 2014-01-28 LAB — PREALBUMIN: PREALBUMIN: 40.7 mg/dL — AB (ref 17.0–34.0)

## 2014-01-28 LAB — MAGNESIUM: Magnesium: 2.5 mg/dL (ref 1.5–2.5)

## 2014-01-29 LAB — BASIC METABOLIC PANEL
BUN: 71 mg/dL — ABNORMAL HIGH (ref 6–23)
CO2: 26 mEq/L (ref 19–32)
Calcium: 8.6 mg/dL (ref 8.4–10.5)
Chloride: 94 mEq/L — ABNORMAL LOW (ref 96–112)
Creatinine, Ser: 3.36 mg/dL — ABNORMAL HIGH (ref 0.50–1.10)
GFR calc Af Amer: 15 mL/min — ABNORMAL LOW (ref >90)
GFR, EST NON AFRICAN AMERICAN: 13 mL/min — AB (ref >90)
Glucose, Bld: 126 mg/dL — ABNORMAL HIGH (ref 70–99)
Potassium: 5.1 mEq/L (ref 3.7–5.3)
SODIUM: 135 meq/L — AB (ref 137–147)

## 2014-01-29 LAB — CBC
HCT: 24.9 % — ABNORMAL LOW (ref 36.0–46.0)
Hemoglobin: 8.2 g/dL — ABNORMAL LOW (ref 12.0–15.0)
MCH: 30.1 pg (ref 26.0–34.0)
MCHC: 32.9 g/dL (ref 30.0–36.0)
MCV: 91.5 fL (ref 78.0–100.0)
Platelets: 242 10*3/uL (ref 150–400)
RBC: 2.72 MIL/uL — ABNORMAL LOW (ref 3.87–5.11)
RDW: 16.4 % — ABNORMAL HIGH (ref 11.5–15.5)
WBC: 13.8 10*3/uL — ABNORMAL HIGH (ref 4.0–10.5)

## 2014-01-29 LAB — PROTIME-INR
INR: 1.07 (ref 0.00–1.49)
PROTHROMBIN TIME: 13.7 s (ref 11.6–15.2)

## 2014-01-30 ENCOUNTER — Telehealth (INDEPENDENT_AMBULATORY_CARE_PROVIDER_SITE_OTHER): Payer: Self-pay

## 2014-01-30 LAB — CBC
HEMATOCRIT: 26.3 % — AB (ref 36.0–46.0)
Hemoglobin: 8.9 g/dL — ABNORMAL LOW (ref 12.0–15.0)
MCH: 30.7 pg (ref 26.0–34.0)
MCHC: 33.8 g/dL (ref 30.0–36.0)
MCV: 90.7 fL (ref 78.0–100.0)
PLATELETS: 226 10*3/uL (ref 150–400)
RBC: 2.9 MIL/uL — ABNORMAL LOW (ref 3.87–5.11)
RDW: 16.7 % — ABNORMAL HIGH (ref 11.5–15.5)
WBC: 13.8 10*3/uL — AB (ref 4.0–10.5)

## 2014-01-30 LAB — RENAL FUNCTION PANEL
Albumin: 2.5 g/dL — ABNORMAL LOW (ref 3.5–5.2)
BUN: 103 mg/dL — ABNORMAL HIGH (ref 6–23)
CHLORIDE: 95 meq/L — AB (ref 96–112)
CO2: 22 mEq/L (ref 19–32)
Calcium: 8.9 mg/dL (ref 8.4–10.5)
Creatinine, Ser: 4.07 mg/dL — ABNORMAL HIGH (ref 0.50–1.10)
GFR, EST AFRICAN AMERICAN: 12 mL/min — AB (ref >90)
GFR, EST NON AFRICAN AMERICAN: 10 mL/min — AB (ref >90)
Glucose, Bld: 133 mg/dL — ABNORMAL HIGH (ref 70–99)
POTASSIUM: 5.3 meq/L (ref 3.7–5.3)
Phosphorus: 5.9 mg/dL — ABNORMAL HIGH (ref 2.3–4.6)
Sodium: 136 mEq/L — ABNORMAL LOW (ref 137–147)

## 2014-01-30 LAB — PROTIME-INR
INR: 1.12 (ref 0.00–1.49)
PROTHROMBIN TIME: 14.2 s (ref 11.6–15.2)

## 2014-01-30 NOTE — Telephone Encounter (Signed)
Error - wrong pt

## 2014-01-31 LAB — PROTIME-INR
INR: 1.13 (ref 0.00–1.49)
Prothrombin Time: 14.3 seconds (ref 11.6–15.2)

## 2014-02-01 LAB — CBC
HCT: 25.9 % — ABNORMAL LOW (ref 36.0–46.0)
Hemoglobin: 8.8 g/dL — ABNORMAL LOW (ref 12.0–15.0)
MCH: 30.4 pg (ref 26.0–34.0)
MCHC: 34 g/dL (ref 30.0–36.0)
MCV: 89.6 fL (ref 78.0–100.0)
PLATELETS: 166 10*3/uL (ref 150–400)
RBC: 2.89 MIL/uL — ABNORMAL LOW (ref 3.87–5.11)
RDW: 16.9 % — ABNORMAL HIGH (ref 11.5–15.5)
WBC: 20.1 10*3/uL — ABNORMAL HIGH (ref 4.0–10.5)

## 2014-02-01 LAB — RENAL FUNCTION PANEL
ALBUMIN: 2.6 g/dL — AB (ref 3.5–5.2)
BUN: 149 mg/dL — ABNORMAL HIGH (ref 6–23)
CALCIUM: 9.1 mg/dL (ref 8.4–10.5)
CO2: 20 mEq/L (ref 19–32)
CREATININE: 4.77 mg/dL — AB (ref 0.50–1.10)
Chloride: 96 mEq/L (ref 96–112)
GFR calc Af Amer: 10 mL/min — ABNORMAL LOW (ref >90)
GFR calc non Af Amer: 8 mL/min — ABNORMAL LOW (ref >90)
Glucose, Bld: 99 mg/dL (ref 70–99)
Phosphorus: 7.7 mg/dL — ABNORMAL HIGH (ref 2.3–4.6)
Potassium: 5.8 mEq/L — ABNORMAL HIGH (ref 3.7–5.3)
Sodium: 137 mEq/L (ref 137–147)

## 2014-02-01 LAB — CULTURE, BLOOD (ROUTINE X 2)
CULTURE: NO GROWTH
Culture: NO GROWTH

## 2014-02-01 LAB — PROTIME-INR
INR: 1.22 (ref 0.00–1.49)
Prothrombin Time: 15.1 seconds (ref 11.6–15.2)

## 2014-02-02 ENCOUNTER — Other Ambulatory Visit (HOSPITAL_COMMUNITY): Payer: Self-pay

## 2014-02-02 ENCOUNTER — Other Ambulatory Visit (HOSPITAL_COMMUNITY)

## 2014-02-02 LAB — CBC WITH DIFFERENTIAL/PLATELET
Basophils Absolute: 0 10*3/uL (ref 0.0–0.1)
Basophils Relative: 0 % (ref 0–1)
Eosinophils Absolute: 0 10*3/uL (ref 0.0–0.7)
Eosinophils Relative: 0 % (ref 0–5)
HEMATOCRIT: 24.2 % — AB (ref 36.0–46.0)
Hemoglobin: 8.2 g/dL — ABNORMAL LOW (ref 12.0–15.0)
LYMPHS PCT: 6 % — AB (ref 12–46)
Lymphs Abs: 1.1 10*3/uL (ref 0.7–4.0)
MCH: 30.8 pg (ref 26.0–34.0)
MCHC: 33.9 g/dL (ref 30.0–36.0)
MCV: 91 fL (ref 78.0–100.0)
MONO ABS: 0.3 10*3/uL (ref 0.1–1.0)
MONOS PCT: 2 % — AB (ref 3–12)
Neutro Abs: 17.3 10*3/uL — ABNORMAL HIGH (ref 1.7–7.7)
Neutrophils Relative %: 92 % — ABNORMAL HIGH (ref 43–77)
Platelets: 130 10*3/uL — ABNORMAL LOW (ref 150–400)
RBC: 2.66 MIL/uL — ABNORMAL LOW (ref 3.87–5.11)
RDW: 17.1 % — ABNORMAL HIGH (ref 11.5–15.5)
WBC: 18.6 10*3/uL — AB (ref 4.0–10.5)

## 2014-02-02 LAB — URINALYSIS, ROUTINE W REFLEX MICROSCOPIC
Bilirubin Urine: NEGATIVE
GLUCOSE, UA: NEGATIVE mg/dL
Ketones, ur: NEGATIVE mg/dL
Nitrite: NEGATIVE
PH: 7 (ref 5.0–8.0)
PROTEIN: 100 mg/dL — AB
SPECIFIC GRAVITY, URINE: 1.014 (ref 1.005–1.030)
Urobilinogen, UA: 0.2 mg/dL (ref 0.0–1.0)

## 2014-02-02 LAB — PROCALCITONIN: Procalcitonin: 0.17 ng/mL

## 2014-02-02 LAB — PROTIME-INR
INR: 1.21 (ref 0.00–1.49)
Prothrombin Time: 15 seconds (ref 11.6–15.2)

## 2014-02-02 LAB — URINE MICROSCOPIC-ADD ON

## 2014-02-02 LAB — POTASSIUM: Potassium: 4.5 mEq/L (ref 3.7–5.3)

## 2014-02-03 LAB — CBC
HCT: 23.7 % — ABNORMAL LOW (ref 36.0–46.0)
Hemoglobin: 7.9 g/dL — ABNORMAL LOW (ref 12.0–15.0)
MCH: 30.6 pg (ref 26.0–34.0)
MCHC: 33.3 g/dL (ref 30.0–36.0)
MCV: 91.9 fL (ref 78.0–100.0)
Platelets: 105 10*3/uL — ABNORMAL LOW (ref 150–400)
RBC: 2.58 MIL/uL — AB (ref 3.87–5.11)
RDW: 17.2 % — ABNORMAL HIGH (ref 11.5–15.5)
WBC: 20.3 10*3/uL — ABNORMAL HIGH (ref 4.0–10.5)

## 2014-02-03 LAB — BASIC METABOLIC PANEL
BUN: 97 mg/dL — ABNORMAL HIGH (ref 6–23)
CO2: 26 mEq/L (ref 19–32)
Calcium: 8.4 mg/dL (ref 8.4–10.5)
Chloride: 92 mEq/L — ABNORMAL LOW (ref 96–112)
Creatinine, Ser: 4.3 mg/dL — ABNORMAL HIGH (ref 0.50–1.10)
GFR calc Af Amer: 11 mL/min — ABNORMAL LOW (ref >90)
GFR, EST NON AFRICAN AMERICAN: 10 mL/min — AB (ref >90)
Glucose, Bld: 77 mg/dL (ref 70–99)
POTASSIUM: 5.1 meq/L (ref 3.7–5.3)
SODIUM: 137 meq/L (ref 137–147)

## 2014-02-03 LAB — PROTIME-INR
INR: 1.14 (ref 0.00–1.49)
PROTHROMBIN TIME: 14.4 s (ref 11.6–15.2)

## 2014-02-03 LAB — URINE CULTURE: Special Requests: NORMAL

## 2014-02-04 ENCOUNTER — Other Ambulatory Visit (HOSPITAL_COMMUNITY)

## 2014-02-04 LAB — CBC WITH DIFFERENTIAL/PLATELET
Basophils Absolute: 0 10*3/uL (ref 0.0–0.1)
Basophils Relative: 0 % (ref 0–1)
EOS PCT: 1 % (ref 0–5)
Eosinophils Absolute: 0.2 10*3/uL (ref 0.0–0.7)
HEMATOCRIT: 21.6 % — AB (ref 36.0–46.0)
Hemoglobin: 7.2 g/dL — ABNORMAL LOW (ref 12.0–15.0)
LYMPHS ABS: 2.6 10*3/uL (ref 0.7–4.0)
LYMPHS PCT: 14 % (ref 12–46)
MCH: 30.5 pg (ref 26.0–34.0)
MCHC: 33.3 g/dL (ref 30.0–36.0)
MCV: 91.5 fL (ref 78.0–100.0)
MONO ABS: 0.9 10*3/uL (ref 0.1–1.0)
Monocytes Relative: 5 % (ref 3–12)
Neutro Abs: 14.4 10*3/uL — ABNORMAL HIGH (ref 1.7–7.7)
Neutrophils Relative %: 80 % — ABNORMAL HIGH (ref 43–77)
Platelets: 72 10*3/uL — ABNORMAL LOW (ref 150–400)
RBC: 2.36 MIL/uL — AB (ref 3.87–5.11)
RDW: 17 % — AB (ref 11.5–15.5)
WBC: 18 10*3/uL — AB (ref 4.0–10.5)

## 2014-02-04 LAB — PREALBUMIN: Prealbumin: 37.5 mg/dL — ABNORMAL HIGH (ref 17.0–34.0)

## 2014-02-04 LAB — COMPREHENSIVE METABOLIC PANEL
ALT: 52 U/L — ABNORMAL HIGH (ref 0–35)
AST: 30 U/L (ref 0–37)
Albumin: 2.2 g/dL — ABNORMAL LOW (ref 3.5–5.2)
Alkaline Phosphatase: 57 U/L (ref 39–117)
BUN: 124 mg/dL — AB (ref 6–23)
CALCIUM: 9.2 mg/dL (ref 8.4–10.5)
CO2: 24 meq/L (ref 19–32)
CREATININE: 5.09 mg/dL — AB (ref 0.50–1.10)
Chloride: 96 mEq/L (ref 96–112)
GFR, EST AFRICAN AMERICAN: 9 mL/min — AB (ref >90)
GFR, EST NON AFRICAN AMERICAN: 8 mL/min — AB (ref >90)
GLUCOSE: 62 mg/dL — AB (ref 70–99)
Potassium: 5.4 mEq/L — ABNORMAL HIGH (ref 3.7–5.3)
Sodium: 138 mEq/L (ref 137–147)
Total Bilirubin: <0.2 mg/dL — ABNORMAL LOW (ref 0.3–1.2)
Total Protein: 4.6 g/dL — ABNORMAL LOW (ref 6.0–8.3)

## 2014-02-04 LAB — PROCALCITONIN: PROCALCITONIN: 0.95 ng/mL

## 2014-02-04 LAB — PROTIME-INR
INR: 1.37 (ref 0.00–1.49)
Prothrombin Time: 16.5 seconds — ABNORMAL HIGH (ref 11.6–15.2)

## 2014-02-04 LAB — HEPATITIS B CORE ANTIBODY, TOTAL: Hep B Core Total Ab: NONREACTIVE

## 2014-02-04 LAB — HEPATITIS B SURFACE ANTIGEN: Hepatitis B Surface Ag: NEGATIVE

## 2014-02-04 LAB — PREPARE RBC (CROSSMATCH)

## 2014-02-04 LAB — HEPATITIS B SURFACE ANTIBODY,QUALITATIVE: Hep B S Ab: NEGATIVE

## 2014-02-05 ENCOUNTER — Other Ambulatory Visit (HOSPITAL_COMMUNITY)

## 2014-02-05 LAB — BASIC METABOLIC PANEL
BUN: 46 mg/dL — AB (ref 6–23)
CALCIUM: 8.4 mg/dL (ref 8.4–10.5)
CO2: 24 mEq/L (ref 19–32)
CREATININE: 2.59 mg/dL — AB (ref 0.50–1.10)
Chloride: 96 mEq/L (ref 96–112)
GFR, EST AFRICAN AMERICAN: 21 mL/min — AB (ref >90)
GFR, EST NON AFRICAN AMERICAN: 18 mL/min — AB (ref >90)
Glucose, Bld: 165 mg/dL — ABNORMAL HIGH (ref 70–99)
POTASSIUM: 4.5 meq/L (ref 3.7–5.3)
Sodium: 137 mEq/L (ref 137–147)

## 2014-02-05 LAB — TYPE AND SCREEN
ABO/RH(D): A POS
Antibody Screen: POSITIVE
DAT, IgG: NEGATIVE
UNIT DIVISION: 0
Unit division: 0

## 2014-02-05 LAB — HEPATITIS B SURFACE ANTIGEN: HEP B S AG: NEGATIVE

## 2014-02-05 LAB — HEPATIC FUNCTION PANEL
ALT: 52 U/L — ABNORMAL HIGH (ref 0–35)
AST: 19 U/L (ref 0–37)
Albumin: 2.1 g/dL — ABNORMAL LOW (ref 3.5–5.2)
Alkaline Phosphatase: 62 U/L (ref 39–117)
Bilirubin, Direct: <0.2 mg/dL (ref 0.0–0.3)
TOTAL PROTEIN: 4.9 g/dL — AB (ref 6.0–8.3)
Total Bilirubin: 0.2 mg/dL — ABNORMAL LOW (ref 0.3–1.2)

## 2014-02-05 LAB — CBC WITH DIFFERENTIAL/PLATELET
BASOS PCT: 0 % (ref 0–1)
Basophils Absolute: 0 10*3/uL (ref 0.0–0.1)
EOS ABS: 0 10*3/uL (ref 0.0–0.7)
Eosinophils Relative: 0 % (ref 0–5)
HCT: 26 % — ABNORMAL LOW (ref 36.0–46.0)
HEMOGLOBIN: 8.9 g/dL — AB (ref 12.0–15.0)
Lymphocytes Relative: 5 % — ABNORMAL LOW (ref 12–46)
Lymphs Abs: 0.6 10*3/uL — ABNORMAL LOW (ref 0.7–4.0)
MCH: 30.1 pg (ref 26.0–34.0)
MCHC: 34.2 g/dL (ref 30.0–36.0)
MCV: 87.8 fL (ref 78.0–100.0)
Monocytes Absolute: 0.4 10*3/uL (ref 0.1–1.0)
Monocytes Relative: 3 % (ref 3–12)
NEUTROS PCT: 92 % — AB (ref 43–77)
Neutro Abs: 12.2 10*3/uL — ABNORMAL HIGH (ref 1.7–7.7)
Platelets: 55 10*3/uL — ABNORMAL LOW (ref 150–400)
RBC: 2.96 MIL/uL — ABNORMAL LOW (ref 3.87–5.11)
RDW: 16.1 % — AB (ref 11.5–15.5)
WBC: 13.3 10*3/uL — ABNORMAL HIGH (ref 4.0–10.5)

## 2014-02-05 LAB — PROTIME-INR
INR: 1.31 (ref 0.00–1.49)
PROTHROMBIN TIME: 16 s — AB (ref 11.6–15.2)

## 2014-02-05 LAB — PROCALCITONIN: PROCALCITONIN: 3.26 ng/mL

## 2014-02-05 NOTE — Progress Notes (Signed)
  Subjective: Pt with Hx renal cell ca Left nephrectomy and partial Rt nephrectomy 11/2013 Rt PCN placed at that time; "another smaller tube also placed then" Lives in a SNF Was taken to [facility] 3 weeks ago for abd pain "smaller tube broke and was removed in ER" Sent home but returned to ER--sepsis and transferred to [facility] Request made for Rt PCN exchange UA 4/4: + yeast  Objective: Vital signs in last 24 hours:      Intake/Output from previous day:   Intake/Output this shift:    PE:  Afeb; VSS Obese A/O; pleasant Rt PCN intact: output yellow urine in bag Site clean - no sutures    Lab Results:   Recent Labs  02/04/14 0600 02/05/14 0500  WBC 18.0* 13.3*  HGB 7.2* 8.9*  HCT 21.6* 26.0*  PLT 72* 55*   BMET  Recent Labs  02/04/14 0600 02/05/14 0500  NA 138 137  K 5.4* 4.5  CL 96 96  CO2 24 24  GLUCOSE 62* 165*  BUN 124* 46*  CREATININE 5.09* 2.59*  CALCIUM 9.2 8.4   PT/INR  Recent Labs  02/04/14 0600 02/05/14 0500  LABPROT 16.5* 16.0*  INR 1.37 1.31   ABG No results found for this basename: PHART, PCO2, PO2, HCO3,  in the last 72 hours  Studies/Results: Dg Chest Port 1 View  02/04/2014   CLINICAL DATA:  Decreased breath.  Respiratory failure  EXAM: PORTABLE CHEST - 1 VIEW  COMPARISON:  01/25/2014.  FINDINGS: Right central line tip right atrium level.  Left central line tip distal superior vena cava/ cavoatrial junction level.  No gross pneumothorax.  Cardiomegaly  Pulmonary vascular congestion most notable centrally.  Calcified tortuous aorta  Poor inspiration with elevated hemidiaphragms.  Left base difficult to adequately assess secondary to motion and technique.  IMPRESSION: Cardiomegaly.  Pulmonary vascular congestion most notable centrally.  Please see above.   Electronically Signed   By: Bridgett LarssonSteve  Olson M.D.   On: 02/04/2014 10:31    Anti-infectives: Anti-infectives   None      Assessment/Plan: s/p * No surgery found  *  RCC Left nephrectomy and partial Rt nephrectomy 11/2013 Rt PCN placed then Recent development sepis and transferred to [facility] UA +yeast On antibx For CT abd/pel now Scheduled for Rt PCN exchange Pt is aware of procedure benefits and risks and agreeable to proceed Consent signed and in chart   LOS: 18 days    Gorden Stthomas A 02/05/2014

## 2014-02-05 NOTE — Progress Notes (Signed)
R sided PCN is actually outside the R kidney and NOT a right sided PCN. There is no fluid collection about this catheter and thus, the utility of this catheter is indeterminate on this examination.  Potentially this catheter represents the sequela of now resolved perinephric abscess/urinoma.   Pt has double J ureteral stent which appears appropriately positioned.  No right sided hydro.  D/W Dr. Arnette NorrisBarakat.

## 2014-02-11 LAB — CULTURE, BLOOD (ROUTINE X 2): CULTURE: NO GROWTH

## 2014-03-01 ENCOUNTER — Ambulatory Visit: Payer: Self-pay | Admitting: Internal Medicine

## 2014-03-06 LAB — CK-MB: CK-MB: 3.4 ng/mL (ref 0.5–3.6)

## 2014-03-06 LAB — CBC
HCT: 26.7 % — AB (ref 35.0–47.0)
HGB: 8.3 g/dL — ABNORMAL LOW (ref 12.0–16.0)
MCH: 30.3 pg (ref 26.0–34.0)
MCHC: 31 g/dL — ABNORMAL LOW (ref 32.0–36.0)
MCV: 98 fL (ref 80–100)
Platelet: 360 10*3/uL (ref 150–440)
RBC: 2.74 10*6/uL — ABNORMAL LOW (ref 3.80–5.20)
RDW: 21.3 % — AB (ref 11.5–14.5)
WBC: 12 10*3/uL — AB (ref 3.6–11.0)

## 2014-03-06 LAB — COMPREHENSIVE METABOLIC PANEL
ALT: 17 U/L (ref 12–78)
ANION GAP: 4 — AB (ref 7–16)
Albumin: 1.8 g/dL — ABNORMAL LOW (ref 3.4–5.0)
Alkaline Phosphatase: 83 U/L
BUN: 24 mg/dL — ABNORMAL HIGH (ref 7–18)
Bilirubin,Total: 0.3 mg/dL (ref 0.2–1.0)
CHLORIDE: 101 mmol/L (ref 98–107)
CREATININE: 3.33 mg/dL — AB (ref 0.60–1.30)
Calcium, Total: 8.1 mg/dL — ABNORMAL LOW (ref 8.5–10.1)
Co2: 34 mmol/L — ABNORMAL HIGH (ref 21–32)
GFR CALC AF AMER: 16 — AB
GFR CALC NON AF AMER: 13 — AB
Glucose: 76 mg/dL (ref 65–99)
OSMOLALITY: 280 (ref 275–301)
POTASSIUM: 3.9 mmol/L (ref 3.5–5.1)
SGOT(AST): 21 U/L (ref 15–37)
Sodium: 139 mmol/L (ref 136–145)
Total Protein: 5 g/dL — ABNORMAL LOW (ref 6.4–8.2)

## 2014-03-06 LAB — PROTIME-INR
INR: 1.8
PROTHROMBIN TIME: 20.3 s — AB (ref 11.5–14.7)

## 2014-03-06 LAB — TROPONIN I: Troponin-I: 0.64 ng/mL — ABNORMAL HIGH

## 2014-03-06 LAB — CK TOTAL AND CKMB (NOT AT ARMC)
CK, Total: 46 U/L
CK-MB: 3.5 ng/mL (ref 0.5–3.6)

## 2014-03-07 ENCOUNTER — Inpatient Hospital Stay: Payer: Self-pay | Admitting: Internal Medicine

## 2014-03-07 LAB — CK-MB
CK-MB: 3.6 ng/mL (ref 0.5–3.6)
CK-MB: 3 ng/mL (ref 0.5–3.6)

## 2014-03-07 LAB — MAGNESIUM: MAGNESIUM: 1.7 mg/dL — AB

## 2014-03-07 LAB — TROPONIN I
Troponin-I: 0.42 ng/mL — ABNORMAL HIGH
Troponin-I: 0.36 ng/mL — ABNORMAL HIGH

## 2014-03-07 LAB — PHOSPHORUS: PHOSPHORUS: 5.4 mg/dL — AB (ref 2.5–4.9)

## 2014-03-08 LAB — BASIC METABOLIC PANEL
ANION GAP: 8 (ref 7–16)
BUN: 18 mg/dL (ref 7–18)
CALCIUM: 8.1 mg/dL — AB (ref 8.5–10.1)
CREATININE: 2.41 mg/dL — AB (ref 0.60–1.30)
Chloride: 95 mmol/L — ABNORMAL LOW (ref 98–107)
Co2: 32 mmol/L (ref 21–32)
EGFR (African American): 23 — ABNORMAL LOW
EGFR (Non-African Amer.): 20 — ABNORMAL LOW
Glucose: 162 mg/dL — ABNORMAL HIGH (ref 65–99)
Osmolality: 276 (ref 275–301)
Potassium: 3.9 mmol/L (ref 3.5–5.1)
Sodium: 135 mmol/L — ABNORMAL LOW (ref 136–145)

## 2014-03-08 LAB — CBC WITH DIFFERENTIAL/PLATELET
BASOS PCT: 0.2 %
Basophil #: 0 10*3/uL (ref 0.0–0.1)
EOS PCT: 0 %
Eosinophil #: 0 10*3/uL (ref 0.0–0.7)
HCT: 27.1 % — AB (ref 35.0–47.0)
HGB: 8.4 g/dL — ABNORMAL LOW (ref 12.0–16.0)
LYMPHS PCT: 5.8 %
Lymphocyte #: 0.6 10*3/uL — ABNORMAL LOW (ref 1.0–3.6)
MCH: 30.4 pg (ref 26.0–34.0)
MCHC: 31 g/dL — ABNORMAL LOW (ref 32.0–36.0)
MCV: 98 fL (ref 80–100)
MONO ABS: 0.3 x10 3/mm (ref 0.2–0.9)
MONOS PCT: 2.6 %
Neutrophil #: 10.1 10*3/uL — ABNORMAL HIGH (ref 1.4–6.5)
Neutrophil %: 91.4 %
Platelet: 405 10*3/uL (ref 150–440)
RBC: 2.76 10*6/uL — AB (ref 3.80–5.20)
RDW: 21.2 % — ABNORMAL HIGH (ref 11.5–14.5)
WBC: 11.1 10*3/uL — ABNORMAL HIGH (ref 3.6–11.0)

## 2014-03-08 LAB — PROTIME-INR
INR: 2
PROTHROMBIN TIME: 21.9 s — AB (ref 11.5–14.7)

## 2014-03-09 LAB — PROTIME-INR
INR: 2.1
PROTHROMBIN TIME: 23.4 s — AB (ref 11.5–14.7)

## 2014-03-09 LAB — CBC WITH DIFFERENTIAL/PLATELET
BASOS ABS: 0 10*3/uL (ref 0.0–0.1)
Basophil %: 0.4 %
EOS PCT: 0 %
Eosinophil #: 0 10*3/uL (ref 0.0–0.7)
HCT: 25.1 % — ABNORMAL LOW (ref 35.0–47.0)
HGB: 7.6 g/dL — ABNORMAL LOW (ref 12.0–16.0)
Lymphocyte #: 0.5 10*3/uL — ABNORMAL LOW (ref 1.0–3.6)
Lymphocyte %: 4.1 %
MCH: 29.4 pg (ref 26.0–34.0)
MCHC: 30.2 g/dL — ABNORMAL LOW (ref 32.0–36.0)
MCV: 97 fL (ref 80–100)
Monocyte #: 0.7 x10 3/mm (ref 0.2–0.9)
Monocyte %: 5.3 %
NEUTROS PCT: 90.2 %
Neutrophil #: 11.9 10*3/uL — ABNORMAL HIGH (ref 1.4–6.5)
Platelet: 371 10*3/uL (ref 150–440)
RBC: 2.59 10*6/uL — ABNORMAL LOW (ref 3.80–5.20)
RDW: 21.2 % — AB (ref 11.5–14.5)
WBC: 13.2 10*3/uL — ABNORMAL HIGH (ref 3.6–11.0)

## 2014-03-09 LAB — BASIC METABOLIC PANEL
ANION GAP: 5 — AB (ref 7–16)
BUN: 30 mg/dL — ABNORMAL HIGH (ref 7–18)
CALCIUM: 8.1 mg/dL — AB (ref 8.5–10.1)
CHLORIDE: 94 mmol/L — AB (ref 98–107)
CO2: 34 mmol/L — AB (ref 21–32)
CREATININE: 3.72 mg/dL — AB (ref 0.60–1.30)
EGFR (Non-African Amer.): 12 — ABNORMAL LOW
GFR CALC AF AMER: 14 — AB
Glucose: 215 mg/dL — ABNORMAL HIGH (ref 65–99)
OSMOLALITY: 279 (ref 275–301)
Potassium: 4 mmol/L (ref 3.5–5.1)
SODIUM: 133 mmol/L — AB (ref 136–145)

## 2014-03-09 LAB — PHOSPHORUS: PHOSPHORUS: 2.8 mg/dL (ref 2.5–4.9)

## 2014-03-10 LAB — BASIC METABOLIC PANEL
Anion Gap: 6 — ABNORMAL LOW (ref 7–16)
BUN: 24 mg/dL — AB (ref 7–18)
CALCIUM: 7.8 mg/dL — AB (ref 8.5–10.1)
CHLORIDE: 96 mmol/L — AB (ref 98–107)
CO2: 33 mmol/L — AB (ref 21–32)
CREATININE: 2.42 mg/dL — AB (ref 0.60–1.30)
EGFR (African American): 23 — ABNORMAL LOW
EGFR (Non-African Amer.): 20 — ABNORMAL LOW
GLUCOSE: 187 mg/dL — AB (ref 65–99)
OSMOLALITY: 279 (ref 275–301)
Potassium: 3.8 mmol/L (ref 3.5–5.1)
Sodium: 135 mmol/L — ABNORMAL LOW (ref 136–145)

## 2014-03-10 LAB — CBC WITH DIFFERENTIAL/PLATELET
BASOS ABS: 0 10*3/uL (ref 0.0–0.1)
Basophil %: 0.2 %
EOS PCT: 0 %
Eosinophil #: 0 10*3/uL (ref 0.0–0.7)
HCT: 24.3 % — ABNORMAL LOW (ref 35.0–47.0)
HGB: 7.6 g/dL — AB (ref 12.0–16.0)
Lymphocyte #: 0.6 10*3/uL — ABNORMAL LOW (ref 1.0–3.6)
Lymphocyte %: 4.8 %
MCH: 30.3 pg (ref 26.0–34.0)
MCHC: 31.3 g/dL — ABNORMAL LOW (ref 32.0–36.0)
MCV: 97 fL (ref 80–100)
Monocyte #: 0.7 x10 3/mm (ref 0.2–0.9)
Monocyte %: 5.5 %
NEUTROS ABS: 11.9 10*3/uL — AB (ref 1.4–6.5)
Neutrophil %: 89.5 %
Platelet: 333 10*3/uL (ref 150–440)
RBC: 2.5 10*6/uL — ABNORMAL LOW (ref 3.80–5.20)
RDW: 20.6 % — ABNORMAL HIGH (ref 11.5–14.5)
WBC: 13.3 10*3/uL — AB (ref 3.6–11.0)

## 2014-03-10 LAB — PROTIME-INR
INR: 2
PROTHROMBIN TIME: 22.4 s — AB (ref 11.5–14.7)

## 2014-03-11 LAB — PROTIME-INR
INR: 2.1
PROTHROMBIN TIME: 23.3 s — AB (ref 11.5–14.7)

## 2014-03-11 LAB — CULTURE, BLOOD (SINGLE)

## 2014-03-12 LAB — PHOSPHORUS: PHOSPHORUS: 4.4 mg/dL (ref 2.5–4.9)

## 2014-03-12 LAB — VANCOMYCIN, TROUGH: Vancomycin, Trough: 18 ug/mL (ref 10–20)

## 2014-03-12 LAB — PROTIME-INR
INR: 2.4
Prothrombin Time: 25.4 secs — ABNORMAL HIGH (ref 11.5–14.7)

## 2014-03-12 LAB — CULTURE, BLOOD (SINGLE)

## 2014-03-13 LAB — PROTIME-INR
INR: 2.5
Prothrombin Time: 26.4 secs — ABNORMAL HIGH (ref 11.5–14.7)

## 2014-03-13 LAB — PHOSPHORUS: PHOSPHORUS: 4.6 mg/dL (ref 2.5–4.9)

## 2014-03-14 LAB — HEMOGLOBIN: HGB: 9.5 g/dL — ABNORMAL LOW (ref 12.0–16.0)

## 2014-03-14 LAB — PROTIME-INR
INR: 2.4
PROTHROMBIN TIME: 25.7 s — AB (ref 11.5–14.7)

## 2014-03-14 LAB — PHOSPHORUS: Phosphorus: 3.5 mg/dL (ref 2.5–4.9)

## 2014-03-16 ENCOUNTER — Inpatient Hospital Stay: Payer: Self-pay | Admitting: Internal Medicine

## 2014-03-16 LAB — TROPONIN I
TROPONIN-I: 0.05 ng/mL
TROPONIN-I: 0.06 ng/mL — AB

## 2014-03-16 LAB — COMPREHENSIVE METABOLIC PANEL
ANION GAP: 4 — AB (ref 7–16)
AST: 37 U/L (ref 15–37)
Albumin: 2.2 g/dL — ABNORMAL LOW (ref 3.4–5.0)
Alkaline Phosphatase: 83 U/L
BUN: 37 mg/dL — AB (ref 7–18)
Bilirubin,Total: 0.4 mg/dL (ref 0.2–1.0)
CHLORIDE: 98 mmol/L (ref 98–107)
CO2: 33 mmol/L — AB (ref 21–32)
CREATININE: 2.48 mg/dL — AB (ref 0.60–1.30)
Calcium, Total: 8.5 mg/dL (ref 8.5–10.1)
EGFR (African American): 22 — ABNORMAL LOW
EGFR (Non-African Amer.): 19 — ABNORMAL LOW
Glucose: 88 mg/dL (ref 65–99)
Osmolality: 278 (ref 275–301)
Potassium: 5.4 mmol/L — ABNORMAL HIGH (ref 3.5–5.1)
SGPT (ALT): 33 U/L (ref 12–78)
SODIUM: 135 mmol/L — AB (ref 136–145)
Total Protein: 5.4 g/dL — ABNORMAL LOW (ref 6.4–8.2)

## 2014-03-16 LAB — CBC
HCT: 32.8 % — ABNORMAL LOW (ref 35.0–47.0)
HGB: 10 g/dL — ABNORMAL LOW (ref 12.0–16.0)
MCH: 31 pg (ref 26.0–34.0)
MCHC: 30.6 g/dL — AB (ref 32.0–36.0)
MCV: 101 fL — AB (ref 80–100)
PLATELETS: 334 10*3/uL (ref 150–440)
RBC: 3.23 10*6/uL — ABNORMAL LOW (ref 3.80–5.20)
RDW: 21.5 % — ABNORMAL HIGH (ref 11.5–14.5)
WBC: 20.9 10*3/uL — ABNORMAL HIGH (ref 3.6–11.0)

## 2014-03-16 LAB — CK TOTAL AND CKMB (NOT AT ARMC)
CK, TOTAL: 14 U/L — AB
CK, TOTAL: 124 U/L
CK-MB: 1.2 ng/mL (ref 0.5–3.6)
CK-MB: 1.2 ng/mL (ref 0.5–3.6)

## 2014-03-16 LAB — PROTIME-INR
INR: 2.9
Prothrombin Time: 29.7 secs — ABNORMAL HIGH (ref 11.5–14.7)

## 2014-03-16 LAB — PRO B NATRIURETIC PEPTIDE: B-Type Natriuretic Peptide: 21834 pg/mL — ABNORMAL HIGH (ref 0–125)

## 2014-03-17 LAB — CBC WITH DIFFERENTIAL/PLATELET
BASOS ABS: 0.2 10*3/uL — AB (ref 0.0–0.1)
Basophil %: 1.8 %
EOS ABS: 0 10*3/uL (ref 0.0–0.7)
EOS PCT: 0.1 %
HCT: 28.5 % — ABNORMAL LOW (ref 35.0–47.0)
HGB: 9 g/dL — ABNORMAL LOW (ref 12.0–16.0)
LYMPHS ABS: 0.7 10*3/uL — AB (ref 1.0–3.6)
LYMPHS PCT: 5.2 %
MCH: 31.2 pg (ref 26.0–34.0)
MCHC: 31.6 g/dL — ABNORMAL LOW (ref 32.0–36.0)
MCV: 99 fL (ref 80–100)
Monocyte #: 0.3 x10 3/mm (ref 0.2–0.9)
Monocyte %: 2.2 %
NEUTROS ABS: 11.3 10*3/uL — AB (ref 1.4–6.5)
NEUTROS PCT: 90.7 %
Platelet: 265 10*3/uL (ref 150–440)
RBC: 2.88 10*6/uL — ABNORMAL LOW (ref 3.80–5.20)
RDW: 20.6 % — ABNORMAL HIGH (ref 11.5–14.5)
WBC: 12.5 10*3/uL — AB (ref 3.6–11.0)

## 2014-03-17 LAB — BASIC METABOLIC PANEL
Anion Gap: 5 — ABNORMAL LOW (ref 7–16)
BUN: 26 mg/dL — ABNORMAL HIGH (ref 7–18)
CALCIUM: 8.4 mg/dL — AB (ref 8.5–10.1)
Chloride: 98 mmol/L (ref 98–107)
Co2: 32 mmol/L (ref 21–32)
Creatinine: 2.13 mg/dL — ABNORMAL HIGH (ref 0.60–1.30)
EGFR (Non-African Amer.): 23 — ABNORMAL LOW
GFR CALC AF AMER: 27 — AB
Glucose: 142 mg/dL — ABNORMAL HIGH (ref 65–99)
Osmolality: 277 (ref 275–301)
Potassium: 4.1 mmol/L (ref 3.5–5.1)
SODIUM: 135 mmol/L — AB (ref 136–145)

## 2014-03-17 LAB — PROTIME-INR
INR: 3.3
PROTHROMBIN TIME: 32.2 s — AB (ref 11.5–14.7)

## 2014-03-18 LAB — CBC WITH DIFFERENTIAL/PLATELET
BASOS ABS: 0 10*3/uL (ref 0.0–0.1)
BASOS PCT: 0.2 %
Eosinophil #: 0 10*3/uL (ref 0.0–0.7)
Eosinophil %: 0 %
HCT: 30.2 % — ABNORMAL LOW (ref 35.0–47.0)
HGB: 9.4 g/dL — AB (ref 12.0–16.0)
LYMPHS ABS: 0.4 10*3/uL — AB (ref 1.0–3.6)
Lymphocyte %: 3.7 %
MCH: 30.3 pg (ref 26.0–34.0)
MCHC: 31.2 g/dL — AB (ref 32.0–36.0)
MCV: 97 fL (ref 80–100)
Monocyte #: 0.3 x10 3/mm (ref 0.2–0.9)
Monocyte %: 2.6 %
Neutrophil #: 10.6 10*3/uL — ABNORMAL HIGH (ref 1.4–6.5)
Neutrophil %: 93.5 %
Platelet: 333 10*3/uL (ref 150–440)
RBC: 3.1 10*6/uL — AB (ref 3.80–5.20)
RDW: 20.7 % — ABNORMAL HIGH (ref 11.5–14.5)
WBC: 11.4 10*3/uL — ABNORMAL HIGH (ref 3.6–11.0)

## 2014-03-18 LAB — BASIC METABOLIC PANEL
Anion Gap: 8 (ref 7–16)
BUN: 45 mg/dL — ABNORMAL HIGH (ref 7–18)
CALCIUM: 8.7 mg/dL (ref 8.5–10.1)
CO2: 31 mmol/L (ref 21–32)
CREATININE: 3.08 mg/dL — AB (ref 0.60–1.30)
Chloride: 99 mmol/L (ref 98–107)
GFR CALC AF AMER: 17 — AB
GFR CALC NON AF AMER: 15 — AB
GLUCOSE: 181 mg/dL — AB (ref 65–99)
OSMOLALITY: 292 (ref 275–301)
Potassium: 4.5 mmol/L (ref 3.5–5.1)
Sodium: 138 mmol/L (ref 136–145)

## 2014-03-18 LAB — PROTIME-INR
INR: 4.3
PROTHROMBIN TIME: 39.6 s — AB (ref 11.5–14.7)

## 2014-03-19 LAB — CBC WITH DIFFERENTIAL/PLATELET
Basophil #: 0 10*3/uL (ref 0.0–0.1)
Basophil %: 0.1 %
EOS PCT: 0 %
Eosinophil #: 0 10*3/uL (ref 0.0–0.7)
HCT: 29.8 % — ABNORMAL LOW (ref 35.0–47.0)
HGB: 9.4 g/dL — ABNORMAL LOW (ref 12.0–16.0)
LYMPHS PCT: 4.4 %
Lymphocyte #: 0.6 10*3/uL — ABNORMAL LOW (ref 1.0–3.6)
MCH: 30.6 pg (ref 26.0–34.0)
MCHC: 31.5 g/dL — AB (ref 32.0–36.0)
MCV: 97 fL (ref 80–100)
MONOS PCT: 4.2 %
Monocyte #: 0.5 x10 3/mm (ref 0.2–0.9)
NEUTROS PCT: 91.3 %
Neutrophil #: 11.6 10*3/uL — ABNORMAL HIGH (ref 1.4–6.5)
Platelet: 310 10*3/uL (ref 150–440)
RBC: 3.06 10*6/uL — AB (ref 3.80–5.20)
RDW: 20.7 % — AB (ref 11.5–14.5)
WBC: 12.8 10*3/uL — ABNORMAL HIGH (ref 3.6–11.0)

## 2014-03-19 LAB — BASIC METABOLIC PANEL
Anion Gap: 10 (ref 7–16)
BUN: 62 mg/dL — AB (ref 7–18)
CHLORIDE: 100 mmol/L (ref 98–107)
CREATININE: 3.77 mg/dL — AB (ref 0.60–1.30)
Calcium, Total: 8.9 mg/dL (ref 8.5–10.1)
Co2: 29 mmol/L (ref 21–32)
EGFR (Non-African Amer.): 12 — ABNORMAL LOW
GFR CALC AF AMER: 13 — AB
GLUCOSE: 166 mg/dL — AB (ref 65–99)
Osmolality: 299 (ref 275–301)
Potassium: 4.6 mmol/L (ref 3.5–5.1)
Sodium: 139 mmol/L (ref 136–145)

## 2014-03-19 LAB — PROTIME-INR
INR: 5.1
PROTHROMBIN TIME: 45.3 s — AB (ref 11.5–14.7)

## 2014-03-19 LAB — PHOSPHORUS: Phosphorus: 5.5 mg/dL — ABNORMAL HIGH (ref 2.5–4.9)

## 2014-03-20 LAB — CBC WITH DIFFERENTIAL/PLATELET
BASOS PCT: 0.2 %
Basophil #: 0 10*3/uL (ref 0.0–0.1)
Eosinophil #: 0 10*3/uL (ref 0.0–0.7)
Eosinophil %: 0 %
HCT: 29.9 % — ABNORMAL LOW (ref 35.0–47.0)
HGB: 9.6 g/dL — ABNORMAL LOW (ref 12.0–16.0)
LYMPHS PCT: 5.5 %
Lymphocyte #: 1.1 10*3/uL (ref 1.0–3.6)
MCH: 31.4 pg (ref 26.0–34.0)
MCHC: 32 g/dL (ref 32.0–36.0)
MCV: 98 fL (ref 80–100)
MONO ABS: 1 x10 3/mm — AB (ref 0.2–0.9)
MONOS PCT: 5.5 %
NEUTROS PCT: 88.8 %
Neutrophil #: 17 10*3/uL — ABNORMAL HIGH (ref 1.4–6.5)
PLATELETS: 280 10*3/uL (ref 150–440)
RBC: 3.04 10*6/uL — ABNORMAL LOW (ref 3.80–5.20)
RDW: 21.5 % — ABNORMAL HIGH (ref 11.5–14.5)
WBC: 19.1 10*3/uL — ABNORMAL HIGH (ref 3.6–11.0)

## 2014-03-20 LAB — BASIC METABOLIC PANEL
ANION GAP: 8 (ref 7–16)
BUN: 35 mg/dL — ABNORMAL HIGH (ref 7–18)
Calcium, Total: 8.8 mg/dL (ref 8.5–10.1)
Chloride: 98 mmol/L (ref 98–107)
Co2: 31 mmol/L (ref 21–32)
Creatinine: 2.72 mg/dL — ABNORMAL HIGH (ref 0.60–1.30)
EGFR (African American): 20 — ABNORMAL LOW
EGFR (Non-African Amer.): 17 — ABNORMAL LOW
Glucose: 177 mg/dL — ABNORMAL HIGH (ref 65–99)
OSMOLALITY: 286 (ref 275–301)
Potassium: 4 mmol/L (ref 3.5–5.1)
Sodium: 137 mmol/L (ref 136–145)

## 2014-03-20 LAB — PROTIME-INR
INR: 2.3
PROTHROMBIN TIME: 25 s — AB (ref 11.5–14.7)

## 2014-03-21 LAB — CBC WITH DIFFERENTIAL/PLATELET
Basophil #: 0 10*3/uL (ref 0.0–0.1)
Basophil %: 0.1 %
EOS ABS: 0 10*3/uL (ref 0.0–0.7)
Eosinophil %: 0.1 %
HCT: 31.4 % — ABNORMAL LOW (ref 35.0–47.0)
HGB: 9.6 g/dL — ABNORMAL LOW (ref 12.0–16.0)
LYMPHS PCT: 5 %
Lymphocyte #: 0.9 10*3/uL — ABNORMAL LOW (ref 1.0–3.6)
MCH: 30.3 pg (ref 26.0–34.0)
MCHC: 30.4 g/dL — ABNORMAL LOW (ref 32.0–36.0)
MCV: 100 fL (ref 80–100)
MONOS PCT: 5.1 %
Monocyte #: 0.9 x10 3/mm (ref 0.2–0.9)
NEUTROS ABS: 16.4 10*3/uL — AB (ref 1.4–6.5)
Neutrophil %: 89.7 %
PLATELETS: 279 10*3/uL (ref 150–440)
RBC: 3.15 10*6/uL — AB (ref 3.80–5.20)
RDW: 21.5 % — AB (ref 11.5–14.5)
WBC: 18.3 10*3/uL — ABNORMAL HIGH (ref 3.6–11.0)

## 2014-03-21 LAB — BASIC METABOLIC PANEL
ANION GAP: 9 (ref 7–16)
BUN: 47 mg/dL — ABNORMAL HIGH (ref 7–18)
CALCIUM: 9.3 mg/dL (ref 8.5–10.1)
CREATININE: 3.68 mg/dL — AB (ref 0.60–1.30)
Chloride: 97 mmol/L — ABNORMAL LOW (ref 98–107)
Co2: 32 mmol/L (ref 21–32)
EGFR (Non-African Amer.): 12 — ABNORMAL LOW
GFR CALC AF AMER: 14 — AB
GLUCOSE: 157 mg/dL — AB (ref 65–99)
Osmolality: 291 (ref 275–301)
Potassium: 4.5 mmol/L (ref 3.5–5.1)
SODIUM: 138 mmol/L (ref 136–145)

## 2014-03-21 LAB — PROTIME-INR
INR: 1.4
PROTHROMBIN TIME: 17.3 s — AB (ref 11.5–14.7)

## 2014-03-22 LAB — PROTIME-INR
INR: 1.6
PROTHROMBIN TIME: 19.1 s — AB (ref 11.5–14.7)

## 2014-03-22 LAB — CLOSTRIDIUM DIFFICILE(ARMC)

## 2014-03-23 LAB — BASIC METABOLIC PANEL
Anion Gap: 7 (ref 7–16)
BUN: 38 mg/dL — ABNORMAL HIGH (ref 7–18)
CALCIUM: 10.4 mg/dL — AB (ref 8.5–10.1)
CO2: 33 mmol/L — AB (ref 21–32)
Chloride: 96 mmol/L — ABNORMAL LOW (ref 98–107)
Creatinine: 3.13 mg/dL — ABNORMAL HIGH (ref 0.60–1.30)
EGFR (Non-African Amer.): 14 — ABNORMAL LOW
GFR CALC AF AMER: 17 — AB
Glucose: 122 mg/dL — ABNORMAL HIGH (ref 65–99)
Osmolality: 282 (ref 275–301)
POTASSIUM: 4.6 mmol/L (ref 3.5–5.1)
SODIUM: 136 mmol/L (ref 136–145)

## 2014-03-23 LAB — CBC WITH DIFFERENTIAL/PLATELET
Bands: 1 %
HCT: 32.7 % — ABNORMAL LOW (ref 35.0–47.0)
HGB: 10.4 g/dL — AB (ref 12.0–16.0)
LYMPHS PCT: 13 %
MCH: 31.7 pg (ref 26.0–34.0)
MCHC: 31.8 g/dL — AB (ref 32.0–36.0)
MCV: 100 fL (ref 80–100)
Monocytes: 4 %
NRBC/100 WBC: 1 /
Platelet: 263 10*3/uL (ref 150–440)
RBC: 3.27 10*6/uL — AB (ref 3.80–5.20)
RDW: 21.9 % — AB (ref 11.5–14.5)
SEGMENTED NEUTROPHILS: 82 %
WBC: 23.8 10*3/uL — ABNORMAL HIGH (ref 3.6–11.0)

## 2014-03-23 LAB — PHOSPHORUS: Phosphorus: 1.7 mg/dL — ABNORMAL LOW (ref 2.5–4.9)

## 2014-03-23 LAB — PROTIME-INR
INR: 2.2
PROTHROMBIN TIME: 24 s — AB (ref 11.5–14.7)

## 2014-03-24 LAB — CBC WITH DIFFERENTIAL/PLATELET
BASOS PCT: 0.1 %
Basophil #: 0 10*3/uL (ref 0.0–0.1)
Eosinophil #: 0 10*3/uL (ref 0.0–0.7)
Eosinophil %: 0 %
HCT: 32.7 % — ABNORMAL LOW (ref 35.0–47.0)
HGB: 10 g/dL — AB (ref 12.0–16.0)
LYMPHS PCT: 9.6 %
Lymphocyte #: 2 10*3/uL (ref 1.0–3.6)
MCH: 30.9 pg (ref 26.0–34.0)
MCHC: 30.6 g/dL — AB (ref 32.0–36.0)
MCV: 101 fL — ABNORMAL HIGH (ref 80–100)
Monocyte #: 0.8 x10 3/mm (ref 0.2–0.9)
Monocyte %: 3.8 %
Neutrophil #: 18 10*3/uL — ABNORMAL HIGH (ref 1.4–6.5)
Neutrophil %: 86.5 %
Platelet: 228 10*3/uL (ref 150–440)
RBC: 3.24 10*6/uL — ABNORMAL LOW (ref 3.80–5.20)
RDW: 23.1 % — ABNORMAL HIGH (ref 11.5–14.5)
WBC: 20.8 10*3/uL — ABNORMAL HIGH (ref 3.6–11.0)

## 2014-03-24 LAB — BASIC METABOLIC PANEL
ANION GAP: 3 — AB (ref 7–16)
BUN: 19 mg/dL — ABNORMAL HIGH (ref 7–18)
CHLORIDE: 101 mmol/L (ref 98–107)
CREATININE: 2.17 mg/dL — AB (ref 0.60–1.30)
Calcium, Total: 8.7 mg/dL (ref 8.5–10.1)
Co2: 33 mmol/L — ABNORMAL HIGH (ref 21–32)
EGFR (African American): 26 — ABNORMAL LOW
EGFR (Non-African Amer.): 23 — ABNORMAL LOW
Glucose: 114 mg/dL — ABNORMAL HIGH (ref 65–99)
OSMOLALITY: 277 (ref 275–301)
Potassium: 3.9 mmol/L (ref 3.5–5.1)
SODIUM: 137 mmol/L (ref 136–145)

## 2014-03-24 LAB — PROTIME-INR
INR: 2.3
Prothrombin Time: 24.6 secs — ABNORMAL HIGH (ref 11.5–14.7)

## 2014-03-25 LAB — PROTIME-INR
INR: 2.5
Prothrombin Time: 26.5 secs — ABNORMAL HIGH (ref 11.5–14.7)

## 2014-03-25 LAB — CBC WITH DIFFERENTIAL/PLATELET
BASOS ABS: 0.1 10*3/uL (ref 0.0–0.1)
BASOS PCT: 0.7 %
Eosinophil #: 0 10*3/uL (ref 0.0–0.7)
Eosinophil %: 0 %
HCT: 32.3 % — ABNORMAL LOW (ref 35.0–47.0)
HGB: 10.1 g/dL — ABNORMAL LOW (ref 12.0–16.0)
Lymphocyte #: 2.1 10*3/uL (ref 1.0–3.6)
Lymphocyte %: 11.7 %
MCH: 31.4 pg (ref 26.0–34.0)
MCHC: 31.2 g/dL — ABNORMAL LOW (ref 32.0–36.0)
MCV: 101 fL — AB (ref 80–100)
MONO ABS: 0.7 x10 3/mm (ref 0.2–0.9)
MONOS PCT: 3.6 %
NEUTROS PCT: 84 %
Neutrophil #: 15.2 10*3/uL — ABNORMAL HIGH (ref 1.4–6.5)
PLATELETS: 239 10*3/uL (ref 150–440)
RBC: 3.21 10*6/uL — AB (ref 3.80–5.20)
RDW: 22.3 % — ABNORMAL HIGH (ref 11.5–14.5)
WBC: 18.1 10*3/uL — AB (ref 3.6–11.0)

## 2014-03-25 LAB — BASIC METABOLIC PANEL
ANION GAP: 8 (ref 7–16)
BUN: 30 mg/dL — ABNORMAL HIGH (ref 7–18)
CALCIUM: 8.6 mg/dL (ref 8.5–10.1)
CHLORIDE: 98 mmol/L (ref 98–107)
CO2: 31 mmol/L (ref 21–32)
Creatinine: 2.97 mg/dL — ABNORMAL HIGH (ref 0.60–1.30)
EGFR (African American): 18 — ABNORMAL LOW
EGFR (Non-African Amer.): 15 — ABNORMAL LOW
Glucose: 112 mg/dL — ABNORMAL HIGH (ref 65–99)
Osmolality: 281 (ref 275–301)
Potassium: 4 mmol/L (ref 3.5–5.1)
Sodium: 137 mmol/L (ref 136–145)

## 2014-03-26 LAB — CBC WITH DIFFERENTIAL/PLATELET
BASOS ABS: 0.1 10*3/uL (ref 0.0–0.1)
BASOS PCT: 0.8 %
Eosinophil #: 0 10*3/uL (ref 0.0–0.7)
Eosinophil %: 0 %
HCT: 34.1 % — ABNORMAL LOW (ref 35.0–47.0)
HGB: 10.5 g/dL — ABNORMAL LOW (ref 12.0–16.0)
Lymphocyte #: 1.4 10*3/uL (ref 1.0–3.6)
Lymphocyte %: 8.2 %
MCH: 31.4 pg (ref 26.0–34.0)
MCHC: 30.8 g/dL — AB (ref 32.0–36.0)
MCV: 102 fL — AB (ref 80–100)
Monocyte #: 0.5 x10 3/mm (ref 0.2–0.9)
Monocyte %: 3 %
Neutrophil #: 14.6 10*3/uL — ABNORMAL HIGH (ref 1.4–6.5)
Neutrophil %: 88 %
PLATELETS: 237 10*3/uL (ref 150–440)
RBC: 3.35 10*6/uL — ABNORMAL LOW (ref 3.80–5.20)
RDW: 22.3 % — ABNORMAL HIGH (ref 11.5–14.5)
WBC: 16.6 10*3/uL — ABNORMAL HIGH (ref 3.6–11.0)

## 2014-03-26 LAB — PHOSPHORUS: PHOSPHORUS: 3.1 mg/dL (ref 2.5–4.9)

## 2014-03-26 LAB — VANCOMYCIN, TROUGH: Vancomycin, Trough: 26 ug/mL (ref 10–20)

## 2014-03-26 LAB — PROTIME-INR
INR: 2.7
Prothrombin Time: 27.8 secs — ABNORMAL HIGH (ref 11.5–14.7)

## 2014-03-27 ENCOUNTER — Ambulatory Visit (HOSPITAL_COMMUNITY)
Admission: AD | Admit: 2014-03-27 | Discharge: 2014-03-27 | Disposition: A | Discharge status: Home / Self Care | Payer: Medicare Other | Source: Ambulatory Visit | Attending: Internal Medicine | Admitting: Internal Medicine

## 2014-03-27 DIAGNOSIS — J189 Pneumonia, unspecified organism: Secondary | ICD-10-CM | POA: Diagnosis present

## 2014-03-27 LAB — CBC WITH DIFFERENTIAL/PLATELET
Basophil #: 0.1 10*3/uL (ref 0.0–0.1)
Basophil %: 0.5 %
Eosinophil #: 0 10*3/uL (ref 0.0–0.7)
Eosinophil %: 0 %
HCT: 36.2 % (ref 35.0–47.0)
HGB: 11.3 g/dL — ABNORMAL LOW (ref 12.0–16.0)
Lymphocyte #: 1.3 10*3/uL (ref 1.0–3.6)
Lymphocyte %: 7.1 %
MCH: 31.6 pg (ref 26.0–34.0)
MCHC: 31.2 g/dL — ABNORMAL LOW (ref 32.0–36.0)
MCV: 101 fL — ABNORMAL HIGH (ref 80–100)
Monocyte #: 0.7 x10 3/mm (ref 0.2–0.9)
Monocyte %: 3.9 %
NEUTROS ABS: 16.4 10*3/uL — AB (ref 1.4–6.5)
Neutrophil %: 88.5 %
PLATELETS: 250 10*3/uL (ref 150–440)
RBC: 3.57 10*6/uL — ABNORMAL LOW (ref 3.80–5.20)
RDW: 22.9 % — ABNORMAL HIGH (ref 11.5–14.5)
WBC: 18.6 10*3/uL — ABNORMAL HIGH (ref 3.6–11.0)

## 2014-03-27 LAB — PROTIME-INR
INR: 2.4
Prothrombin Time: 25.8 secs — ABNORMAL HIGH (ref 11.5–14.7)

## 2014-04-01 ENCOUNTER — Ambulatory Visit: Payer: Self-pay | Admitting: Internal Medicine

## 2014-05-01 DEATH — deceased

## 2015-02-21 NOTE — H&P (Signed)
Past Med/Surgical Hx:  renal cell carcinoma:   Borderline Diabetes:   left kidney removed and part of right kidney:   ALLERGIES:  Penicillin: Unknown  Cipro: Unknown  Phenobarbital: Unknown  Iodine: Unknown   Assessment/Admission Diagnosis This is a 69 y/o F pt of Dr. Einar CrowMarshall Anderson with h/o renal cell carcinoma s/p left nephrectomy and partial rt nephrectomy in 1998 had cryoabation in 2008 and 2009 to rt kidney who presents with rt flank pain. CT scan shows rt renal mass. Pain requiring inpatient mgmt as she lives alone and has a h/o hallucinations with narcotics.  ER discussed case w/Dr Doylene Canninghoksi.  A/ Rt flank pain due to probable recurrence of renal carcinoma.   P/ Pain control Continue out-pt meds. Oncology consult  Full consult dictated.   Electronic Signatures: Raj JanusSolum, Vinaya Sancho M (MD)  (Signed 20-Dec-14 08:52)  Authored: PAST MEDICAL/SURGIAL HISTORY, ALLERGIES, ASSESSMENT AND PLAN   Last Updated: 20-Dec-14 08:52 by Raj JanusSolum, Zyaire Mccleod M (MD)

## 2015-02-21 NOTE — Consult Note (Signed)
PATIENT NAME:  [name], FRIESENHAHN MR#:  [id number] DATE OF BIRTH:  1946-04-30  DATE OF ONCOLOGY CONSULTATION:  10/20/2013  REFERRING PHYSICIAN:  Dr. Tedd Sias   CONSULTING PHYSICIAN:  Trellis Guirguis R. Sherrlyn Hock, MD  REASON FOR CONSULTATION: History of clear cell renal carcinoma with new right flank pain and right renal mass.   HISTORY OF PRESENT ILLNESS: The patient is a 69 year old female with past medical history significant for clear cell renal cell carcinoma, status post left nephrectomy, left adrenalectomy and partial right nephrectomy in 1988. According to patient's history, she had subsequent recurrence in the right kidney with two separate lesions and has undergone cryoablation therapy in 2008 and 2009. All treatments done by Dr. Roselie Awkward at [facility]. Also, has history of hyperlipidemia, hypertension, diet-controlled diabetes, COPD,  history of DVT while on oral contraceptives, mitral valve prolapse, C-section x 2, tonsillectomy. The patient has been admitted to hospital with severe right flank pain for which she was anxious to take narcotics since she gets hallucinations and she lives alone. States the pain is under better control at this time after getting p.r.n. pain medication in hospital. She denies any hematuria. She also has noticed left hip and right knee pain in the last few months, and then, subsequently, has developed intermittent significant pain in the right hip/pelvic area for past few months. Appetite is good, denies unintentional weight loss. She has mild cough. Denies any new dyspnea, chest pain or hemoptysis.   PAST MEDICAL/SURGICAL HISTORY: As in history of present illness above.   ALLERGIES:  CIPROFLOXACIN, PENICILLIN, ASPIRIN, NSAIDS, CONTRAST DYE, SULFA, PHENOBARBITAL.    HOME MEDICATIONS: Lisinopril 5 mg daily, Advair 150/50 2 puffs b.i.d., albuterol nebulizers q.6 hours as needed, Tramadol 2 tablets q.6 hours as needed, Proventil 1 puff q.4 hours p.r.n., Zantac 150 mg daily as  needed, Zyrtec 10 mg daily, simvastatin 10 mg at bedtime.   FAMILY HISTORY: Remarkable for diabetes, hypertension, heart disease.   SOCIAL HISTORY: Denies smoking or alcohol usage. She is a widow, lives alone.    REVIEW OF SYSTEMS:  CONSTITUTIONAL: Has chronic dyspnea on exertion, generalized fatigue on exertion. Denies fevers or chills.  HEENT: No headaches, dizziness, epistaxis, ear or jaw pain.  CARDIAC: Denies angina, palpitation, orthopnea or PND.  LUNGS: As in HPI above.  GASTROINTESTINAL: No nausea, vomiting or diarrhea. No bright red blood in stools or melena.  GENITOURINARY: As in HPI.   SKIN: No new rashes or pruritus.  HEMATOLOGIC: Denies obvious bleeding symptoms.  MUSCULOSKELETAL: As in HPI.   NEUROLOGIC: No new focal weakness, seizures or loss of consciousness.  ENDOCRINE: No polyuria or polydipsia.   PHYSICAL EXAMINATION: GENERAL: The patient is a moderately built and well-nourished individual, resting in bed, alert and oriented x 4 and converses appropriately, no acute distress at rest. No icterus. No pallor.  VITAL SIGNS: 98.6, 97, 20, 117/73, 94% on 2 liters oxygen.  HEENT: Normocephalic, atraumatic. Extraocular movements intact. Sclerae anicteric.  NECK: Negative for lymphadenopathy.  CARDIAC: S1, S2, regular rate and rhythm.  LUNGS: Bilateral diminished breath sounds overall. No rhonchi or crepitation.  ABDOMEN: Soft, mild tenderness in the right lateral and flank area, otherwise no obvious masses palpable. No hepatosplenomegaly clinically.  EXTREMITIES: Shows trace edema. No cyanosis.  MUSCULOSKELETAL: No obvious joint deformity or swelling.  NEUROLOGIC:  Grossly nonfocal, cranial nerves intact.   LAB RESULTS: Urinalysis unremarkable, including negative nitrite and leukocyte esterase. WBC 10,000, hemoglobin 13.4, platelets 209, ANC 6600, creatinine 1.53, calcium 9.4. Liver function tests unremarkable, except  albumin low at 3.1.   IMAGING: CT scan of the abdomen  and pelvis without contrast done earlier today reports two masses in the right kidney; one in the upper pole measuring 6 cm and appears distinct from adrenal gland. The second mass is likely arising from the posterior hilar lymph-feeling arena hilum measuring about 5.5 cm.   IMPRESSION AND RECOMMENDATIONS: A 69 year old female patient with history of medical problems as described above and longstanding history of PSL renal cell carcinoma since 1998 with subsequent recurrence in the right kidney and has had cryotherapy x 2 by Dr. Roselie [facility]wardarey Robertson at [facility], currently admitted with flank pain and CT scan showing two masses in the right kidney measuring 6 cm and 5.5 cm respectively. She also has recurrent significant hip and pelvic pains which could be arthritis versus other etiology like occult metastasis in the bones. Currently pain is under better control, agree with ongoing supportive treatment and p.r.n. medication for pain control. Will also obtain CT scan of the chest (without contrast) to evaluate any further new lung metastasis or mediastinal adenopathy. Will get bone scan, given her pains, to rule out occult bone metastasis. If she does not have metastatic disease, the patient prefers to return to [facility], Dr. Merilynn Finlandobertson, for subsequent care. If she does turn out to have metastatic disease, then she would prefer to take further systemic treatment for stage IV cancer here in [place]. Oncology will follow up after scans are done. If patient is discharged soon, she will be contacted with an appointment to come to the cancer center. The patient is agreeable to this plan.   Thank you for the referral. Please feel free to contact me if additional questions.   ____________________________ Maren ReamerSandeep R. Sherrlyn HockPandit, MD srp:NTS D: 10/21/2013 00:17:33 ET T: 10/21/2013 00:50:44 ET JOB#: [id number]  cc: Darryll CapersSandeep R. Sherrlyn HockPandit, MD, <Dictator> Wille CelesteSANDEEP R Charli Liberatore MD ELECTRONICALLY SIGNED 10/21/2013 11:54

## 2015-02-21 NOTE — Discharge Summary (Signed)
PATIENT NAME:  [name], [name] MR#:  [id number] DATE OF BIRTH:  04-08-1946  DATE OF ADMISSION:  10/22/2013 DATE OF DISCHARGE: 10/24/2013   DISCHARGE DIAGNOSES:  1.  Multiple right renal cell carcinoma masses. 2.  Right flank pain.  3.  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  4.  Type 2 diabetes controlled with diet.  5.  Hypertension.   DISCHARGE MEDICATIONS: Per [facility] med reconciliation system. Briefly, her long-acting tramadol will be stopped. Dilaudid will be given.   HISTORY AND PHYSICAL: Please see detailed history and physical done on admission.   HOSPITAL COURSE: The patient was admitted with diffuse pain, right flank more than elsewhere. Dilaudid IM and p.o. was working though q.8 hours for the p.o. was not an adequate length of time for her as that was given on admission. Pain was a better controlled with q.3 hours p.r.n. orally for her hydromorphone. She had further work-up as guided by oncology consult with a negative whole body bone scan for metastatic disease, though osteoarthritis was seen diffusely. Chest CT showed no lung masses. CT of the pelvis as well as CT of the chest showed multiple right kidney masses, one measured 60 cm and one 5.5 cm. This recurrent renal cell carcinoma was known and had been followed by urology in [place], though she admittedly has not followed up very recently with them. She does agree to follow up with this urologist, who knows her situation well. She has had cryoablation on the right and nephrectomy on the left. She will do so soon. I offered further evaluation and treatment at our cancer center versus other urologists and after a long discussion and my thoughts that continuing with the urologist that knows her well, she wished to follow up with urology in [place]. If she has any trouble getting it arranged she will let me know and we will arrange either oncology here or further urology here depending on her thoughts and wishes. Renal function  was relatively stable at 2.2 on the 22nd was her creatinine, which was in the range where she has been since her nephrectomy. She had no contrast while she was here.   TIME SPENT: Note, it took approximately 37 minutes to do discharge tasks today.  ____________________________ Marya AmslerMarshall W. Dareen PianoAnderson, MD mwa:aw D: 10/24/2013 08:12:43 ET T: 10/24/2013 08:22:36 ET JOB#: [id number]  cc: Marya AmslerMarshall W. Dareen PianoAnderson, MD, <Dictator> Lauro RegulusMARSHALL W Motty Borin MD ELECTRONICALLY SIGNED 10/25/2013 9:30

## 2015-02-21 NOTE — H&P (Signed)
PATIENT NAME:  [name], [name] MR#:  [id number] DATE OF BIRTH:  Feb 13, 1946  DATE OF ADMISSION:  10/20/2013  REQUESTING PHYSICIAN: Valli Glance. Owens Shark, MD  DICTATING PHYSICIAN: A. Lavone Orn, MD  CHIEF COMPLAINT: Right flank pain.   HISTORY OF PRESENT ILLNESS: This is a 69 year old female seen in the Emergency Room at the request of Dr. Owens Shark for pain management. The patient has a history of renal cell carcinoma and underwent left-sided nephrectomy, left adrenalectomy and partial right nephrectomy in 1998. She had been following with Dr. Rutherford Limerick at [facility], and her last followup had been in 2013. She also recalls undergoing right renal cryoablation in 2008 and 2009. She has renal insufficiency with a baseline creatinine of 1.7 to 1.8 in addition to hypertension, hyperlipidemia, COPD and diet-controlled diabetes and arthritis. Several weeks ago, she developed left hip and right knee pain which she attributed to arthritis. She was evaluated by orthopedics for this. Last week, she had new pain in the right hip. She had labs and a urinalysis done at [facility] as she was concerned she might have a urinary tract infection, and her UA was negative and her MET-B was at baseline. Last night, she then developed pain in the right flank. This began around 9:00 p.m. She describes the pain as severe. Pain did not radiate. There were no alleviating or exacerbating factors. She presented to the ER with a chief complaint of this pain. She does have narcotics at home; however, reports hallucinations when taking narcotics. She lives alone and was worried about the potential of those hallucinations. In the ER, she has received pain medication, and she now reports her pain is well controlled at 1 out of 10. She denies any nausea. She denies any recent weight loss.   PAST MEDICAL HISTORY:  1. Renal cell carcinoma, clear cell, status post left nephrectomy, left adrenalectomy and partial right nephrectomy in  1998. Status post right renal cryoablation in 2008 and 2009.  2. Chronic kidney disease, with a baseline creatinine of 1.7 to 1.8.  3. Hyperlipidemia.  4. Hypertension.  5. Diet-controlled diabetes.  6. COPD. 7. History of DVT while on oral contraceptives.  8. Mitral valve prolapse.   PAST SURGICAL HISTORY:  1. Left nephrectomy, left adrenalectomy and partial right nephrectomy in 1998. 2. C-section x2.  3. Tonsillectomy.  MEDICATIONS:  1. Lisinopril 5 mg daily.  2. Advair 150/50 two puffs b.i.d.  3. Proventil 1 puff q.4 hours as needed.  4. Albuterol nebulizer q.6 hours as needed.  5. Tramadol 2 tabs q.6 hours as needed.  6. Zantac 150 mg daily as needed. 7. Zyrtec 10 mg daily.  8. Simvastatin 10 mg at bedtime.   ALLERGIES: THE PATIENT REPORTS ALLERGIES TO CIPROFLOXACIN, SULFA MEDICATIONS, PENICILLIN, ASPIRIN, NSAIDS, CONTRAST DYE, PHENOBARBITAL.   SOCIAL HISTORY: The patient is a widow. She does not smoke or drink any alcohol. She lives alone.   FAMILY HISTORY: Positive for heart disease, diabetes, hypertension.   REVIEW OF SYSTEMS:  GENERAL: No weight loss. No fever.  HEENT: No blurred vision. No sore throat.  NECK: No neck pain or dysphagia.  CARDIAC: No chest pain or palpitation.  PULMONARY: No cough. No shortness of breath.  ABDOMEN: She has had the right flank pain. She denies nausea. Appetite has been fair.  EXTREMITIES: Denies edema.  SKIN: Denies recent skin changes or pruritus.  ENDOCRINE: Denies heat or cold intolerance.  NEUROLOGIC: Denies recent falls or tremor.  PHYSICAL EXAMINATION:  VITAL SIGNS: Blood pressure  135/66, pulse 93, afebrile.  GENERAL: Obese white female in no acute distress.  HEENT: EOMI. Oropharynx is clear. Mucous membranes moist.  NECK: Supple. No appreciable thyromegaly.  CARDIAC: Regular rate and rhythm without murmur.  PULMONARY: Clear to auscultation bilaterally. No wheeze.  ABDOMEN: Diffusely soft, nontender, nondistended. Positive  bowel sounds.  EXTREMITIES: No edema is present.  SKIN: No rash or dermatopathy is present.   LABORATORY DATA: Glucose 110, BUN 20, creatinine 1.53, sodium 135, eGFR 34, calcium 9.4, bilirubin 0.3, albumin 3.1, AST 24, ALT 19. Hematocrit 41.7, WBC 10. Urinalysis negative.   RADIOLOGY:  1. CT abdomen and pelvis without contrast shows evidence of prior left nephrectomy. Right kidney demonstrates a large exophytic mass in the upper pole with dystrophic calcification centrally measuring 6 cm. This mass appears distinct from the adrenal gland. There is a second mass likely arising from the posterior hilar lip, filling the renal hilum and measuring approximately 5.5 cm in diameter. Multiple surgical clips around this lesion. There is pelviectasis secondary to mass effect on the renal pelvis. No stones. No periaortic lymphadenopathy. Findings are concerning for multifocal renal cell carcinoma in the remaining right kidney.  2. Previous CT scan dated 11/06/2008 showed a "newly enhancing renal lesion within the right kidney measuring 2.4 x 2.2 cm."   ASSESSMENT:  1. Right-sided renal cell carcinoma with flank pain.  2. Renal insufficiency, stable.   RECOMMENDATIONS:  1. The patient will be admitted.   2. The patient will be given Dilaudid and p.r.n. acetaminophen for pain control.  3. Oncology will be consulted. 4. It would be helpful to get records from her prior urologist at [facility], as this mass seems to have been present since at least 2010. I will have those records requested.   ____________________________ A. Lavone Orn, MD ams:lb D: 10/20/2013 09:02:41 ET T: 10/20/2013 09:32:09 ET JOB#: [id number]  cc: A. Lavone Orn, MD, <Dictator> Sherlon Handing MD ELECTRONICALLY SIGNED 10/20/2013 19:16

## 2015-02-22 NOTE — H&P (Signed)
PATIENT NAME:  [name], [name] MR#:  [id number] DATE OF BIRTH:  [birth year]  DATE OF ADMISSION:  01/13/2014  PRIMARY CARE PHYSICIAN: Marya AmslerMarshall W. Dareen PianoAnderson, MD  EMERGENCY ROOM PHYSICIAN: Sheryl L. Mindi JunkerGottlieb, MD  CHIEF COMPLAINT: Was brought from [facility] because of elevated temperature and low blood pressure.   HISTORY OF PRESENT ILLNESS: The patient is a very complicated 69 year old female with complicated medical problems, comes in from [facility] because of fever and hypotension. The patient was noted to have temperature of 100.5 with blood pressure of 94/52. According to the family, the patient had dialysis on Thursday and Saturday and after dialysis yesterday the patient felt very nauseous, sick, and also dizzy. The patient did not have any vomiting, but she felt very nauseous and had poor p.o. intake. No diarrhea. Noted to have temperature today. The patient was just discharged from [facility] on Tuesday of the last week. The patient was there from February 22 until March 10. The patient was transferred from our hospital on February 22 because of problems with nephrostomy tube, and the [facility] and they had a wound VAC placed for her for the nephrostomy tube, and the patient just discharged after she was in ICU there. The patient went to [facility]. The patient's wound VAC was removed at the time of discharge, but she still had nephrostomy tube.  The patient's past medical history also includes a history of left nephrectomy a long time back and recently had right-sided neph[facility] on January [facility], and the patient still has like a third of her  right kidney present. According to family, they thought that patient is able to survive without dialysis with this nephrostomy tube. The patient started on dialysis on January 29. She is on dialysis Tuesday, Thursday, and Saturday. She has AV fistula placed, but still not mature yet. ER doctor contacted [facility] regarding the transfer,  but the patient did not want to go there, and [facility] mentioned that the nephrostomy tube anyway is supposed to come out in 2-3 days,. He recommended that that can be discontinued, and the patient is not to have another nephrostomy tube. The ER physician, Dr. Mindi JunkerGottlieb, also spoke to Dr. Selena BattenKim who suggested that [facility], so he recommended admission to hospitalist service and hospitalist team to contact the [facility] people regarding further management. According to Dr. Mindi JunkerGottlieb, who spoke to the [facility] physician,  ,urologist on call, he recommended that patient's nephrostomy tube can be removed, and the family is in agreement with that.   PAST MEDICAL HISTORY: Also includes:  1.  History of pulmonary emboli and also history of ( Right UE DVT,    2.  She also has a history of neuropathy. 3.  COPD. 4.  Steroid dependency.  5.  Hypertension.  6.  Chronic pain.  7.  Past medical history also includes diet-controlled diabetes.   ALLERGIES: SHE IS ALLERGIC TO CIPRO, IODINE, NSAIDS, PENICILLIN, PHENOBARBITAL, SULFA, AND TAPE.   PAST SURGICAL HISTORY: Left nephrectomy, left adrenalectomy, partial right nephrectomy, history of cesarean sections, tonsillectomy.   SOCIAL HISTORY: Widowed. No smoking. No drinking.   FAMILY HISTORY: Significant for hypertension, diabetes.   REVIEW OF SYSTEMS:  CONSTITUTIONAL: The patient has fever and hypotension, and nausea, vomiting since yesterday.  EYES: No blurred vision.  EARS, NOSE, THROAT: No tinnitus. No epistaxis. No difficulty swallowing.  RESPIRATORY: No cough. No wheezing. No trouble breathing.  CARDIOVASCULAR: The patient has no chest pain. No  orthopnea. No PND.  GASTROINTESTINAL: Has nausea, vomiting, and decreased p.o. intake since yesterday. No abdominal pain. No diarrhea.  GENITOURINARY: The patient has no troubles there. ENDOCRINE: No polyuria or nocturia.  INTEGUMENTARY: No skin rashes.  MUSCULOSKELETAL: The patient has no joint  pains.  NEUROLOGIC: The patient has no weakness or dysarthria.  PSYCHIATRIC: No anxiety or insomnia.   MEDICATIONS:  1.  Advair Diskus 250/50, puff Becker.i.d. 2.  Cortef 10 mg 2 tablets in the morning.  3.  Cortef 10 mg at night.  4.  Coumadin 4 mg once a day.  5.  Dilaudid 2 mg p.o. t.i.d.  6.  DuoNebs every 6 hours as needed.  7.  Epogen 4000 units with dialysis on Tuesday, Thursday, Saturday.  8.  Neurontin 100 mg p.o. daily.  9.  Lidoderm patch 5% to affected area.  10.  Metoprolol 25 mg every 12 hours.  11.  Lisinopril 5 mg p.o. daily.  12.  Oxybutynin 5 mg every 8 hours.  13.  Ranitidine 150 mg daily.  14.  Senna 2 tablets 2 times a day.  15.  Zyrtec 10 mg once a day.   PHYSICAL EXAMINATION: VITAL SIGNS: Temperature is 100.5, heart rate 96, blood pressure 94/52, sats 95% on room air.  GENERAL: The patient is lethargic but arousable, able to questions appropriately. The patient is a well-developed, well-nourished female, not in distress.  HEAD: Atraumatic, normocephalic.  EYES: Pupils equally reacting to light.  NOSE: No nasal lesions. No drainage.  EARS: No drainage. No external lesions.  MOUTH: No lesions. Clinically dry.  NECK: Supple. No JVD. No carotid bruit. Normal range of motion.  RESPIRATORY: Clear to auscultation. No wheeze. No rales.  CARDIOVASCULAR: S1, S2 regular, slightly tachycardic. PMI is not displaced. The patient's peripheral pulses are intact.  MUSCULOSKELETAL: Able to move extremities x 4. The patient has good power in both upper and lower extremities.  PSYCHIATRIC: Mood and affect are within normal limits.   LABORATORY AND RADIOLOGICAL DATA: Electrolytes: Sodium is 138, potassium 3.1, chloride 99, bicarbonate 36; BUN is 13, creatinine 2.89, glucose 83. Chest x-ray showed cardiomegaly with no edema. WBC 8.4, hemoglobin 8.4, hematocrit 25.4, platelets 227. Troponin 0.17.   ASSESSMENT AND PLAN: The patient is a 69 year old female with multiple medical  problems, comes in with:  1.  Fever, hypotension. The patient possibly has septic shock arising from her nephrostomy tube. The patient will have her nephrostomy tube removed by the ER physician. We are going to admit her to Intensive Care Unit, start her on Levophed. Will start broad-spectrum antibiotics with vancomycin and Levaquin. The patient already had blood cultures drawn, so follow the blood cultures and continue the Levophed and broad-spectrum antibiotics and see how she does.  2.  History of chronic kidney disease, now needing hemodialysis. We are going to consult nephrology for her hemodialysis needs. 3.  Hypokalemia. Replace the potassium.  4.  History of pulmonary emboli and right upper extremity deep vein thrombosis. The patient is on Coumadin. Check the INR and dose the Coumadin according to INR.  5.  Hypertension. The patient right now is hypotensive. Hold metoprolol and lisinopril.  6.  The patient has history of multiple renal cell cancers with nephrectomy. The patient is on steroids. We are going to continue hydrocortisone 100 mg IV q.8 hours and see how she does.  7.  Lethargy with metabolic encephalopathy due to sepsis. Monitor closely.   The patient's family, sisters, and the patient do not want to go  to [facility], so we are going to keep her here.  CODE STATUS: Full code.   TIME SPENT: More than 60 minutes.    ____________________________ Katha Hamming, MD sk:jcm D: 01/13/2014 14:07:40 ET T: 01/13/2014 15:47:45 ET JOB#: [id number]  cc: Katha Hamming, MD, <Dictator> Katha Hamming MD ELECTRONICALLY SIGNED 01/14/2014 14:10

## 2015-02-22 NOTE — H&P (Signed)
PATIENT NAME:  [name], [name] MR#:  [id number] DATE OF BIRTH:  Aug 06, 1946  DATE OF ADMISSION:  03/16/2014  REFERRING PHYSICIAN:  Dr. Dahlia Client.   PRIMARY CARE PHYSICIAN:  Dr. Frazier Richards of [facility].   CHIEF COMPLAINT:  Respiratory distress.   HISTORY OF PRESENT ILLNESS:  This is a 69 year old female with past medical history of end stage renal disease on hemodialysis, PE on anticoagulation, COPD, hypertension and diabetes, the patient had lengthy hospitalization where she was discharged May 14th to a skilled nursing facility for subacute rehab, the patient was admitted then for respiratory failure due to COPD exacerbation, the patient at the nursing home was noticed to be in respiratory distress and having difficulty with breathing which prompted them to bring EMS, the patient upon presentation was hypoxic requiring BiPAP, the patient had basic work-up done which did show significant leukocytosis of white count of 20,000, and her chest x-ray showing increased right middle lobe consolidation which may reflect pneumonia, as well with evidence of mild volume overload as well, the patient appears to be having anasarca as well, hospitalist requested to admit the patient for further treatment of her respiratory failure, her ABG showing hypercarbic respiratory failure with a pCO2 of 81, the patient was started on broad-spectrum IV antibiotic for healthcare-acquired pneumonia.   REVIEW OF SYSTEMS:  Unable to obtain secondary to patient's lethargy.   PAST MEDICAL HISTORY: 1.  End-stage renal disease, on hemodialysis.  2.  History of PE, on warfarin.  3.  COPD.  4.  Hypertension.  5.  Diabetes.  6.  Adrenal insufficiency.   SOCIAL HISTORY:  No documentation of alcohol, tobacco or drug use.  The patient was recently discharged to subacute rehab.   FAMILY HISTORY:  Significant for hypertension and diabetes.   ALLERGIES:  CIPRO, IODINE NSAIDS, PENICILLIN, PHENOBARBITAL, SULFA AND TAPE.    HOME MEDICATIONS: 1.  Cortef 10 mg oral at bedtime. 2.  Cortef 20 mg oral daily.  3.  Tramadol 50 mg every six hours.  4.  Tylenol as needed.  5.  Warfarin 4 mg oral daily.  6.  Gabapentin 100 mg oral daily.  7.  Insulin sliding scale.  8.  Lantus 10 units subQ every 24 hours.  9.  Xanax 0.5 mg oral 2 tablets every eight hours as needed.  10.  Metoprolol tartrate 50 mg oral every 12 hours.  11.  Advair Diskus 250/50 1 puff every 12 hours for COPD.  12.  DuoNebs every six hours as needed.  13.  Nystatin topical.   PHYSICAL EXAMINATION: VITAL SIGNS:  Temperature 98, pulse 85, respiratory rate 20, blood pressure 98/50, saturating 94% on BiPAP.  GENERAL:  Morbidly obese female, lying in bed, in no apparent distress.  HEENT:  Head atraumatic, normocephalic.  Pupils equal, reactive to light.  Pink conjunctivae.  Anicteric sclerae.  Wearing facial BiPAP mask.  NECK:  Supple.  No thyromegaly.  No JVD.  No carotid bruits.  CHEST:  Had fair air entry bilaterally with scattered wheezing.  No rales.  CARDIOVASCULAR:  S1, S2 heard.  No rubs, murmurs, or gallops.  ABDOMEN:  Obese, soft, nontender, nondistended.  Bowel sounds present.  EXTREMITIES:  +3 edema bilaterally, diminished pulses, but felt bilaterally.  The patient has anasarca all over her extremities with pitting edema in upper and lower extremities.  PSYCHIATRIC:  The patient is lethargic, able to answer only to name.  NEUROLOGIC:  Cranial nerve appears to be grossly intact.  No focal deficits could be  appreciated.  SKIN:  No ulceration, lesions, rash.  Has areas of bruising in upper extremities.   PERTINENT LABORATORY DATA:  Glucose 88, BNP 21,834, BUN 37, creatinine 2.48, sodium 135, potassium 5.4, chloride 98, CO2 33, ALT 33, alk phos 37, AST 83.  Troponin 0.06.  White blood cell 20.9, hemoglobin 10, hematocrit 32.8, platelet 334, pH of 7.27, pO2 of 129, pCO2 of 81.   IMAGING STUDIES:  Chest x-ray showing increasing right middle  lobe consolidation, could reflect pneumonia.   ASSESSMENT AND PLAN: 1.  Acute respiratory failure, appears to be hypercarbic respiratory failure, this appears to be due to pneumonia and chronic obstructive pulmonary, as well to mild volume overload as present with mild pleural effusion, the patient will be continued on BiPAP.  We will repeat ABG and assess in a few hours.  2.  Pneumonia.  The patient will be treated for healthcare-acquired pneumonia, will be started on broad-spectrum intravenous antibiotics.  3.  Chronic obstructive pulmonary disease exacerbation, the patient will be started on Solu-Medrol and nebulizer treatments.  We will continue her on Advair.  4.  History of adrenal insufficiency, currently the patient secondary to her lethargy cannot tolerate by mouth intake, she is on large dose Solu-Medrol which should cover for her adrenal insufficiency.  5.  Anasarca.  The patient had hypoalbuminemia, most likely contributing to her anasarca, we will address her nutritional status once she is able to tolerate by mouth intake.  We will add Nepro for her.  6.  End-stage renal disease on hemodialysis, we will consult nephrology service to resume the patient on hemodialysis.  7.  History of pulmonary embolus.  We will consult pharmacy to dose warfarin, most recent INR on May 14th was 2.4.  8.  Hypertension.  Blood pressure is on the lower side.  We will hold all antihypertensive medication.  9.  Diabetes mellitus.  The patient is currently nothing by mouth.  We will keep monitoring her fingersticks, we will hold insulin.  10.  Deep vein thrombosis prophylaxis.  The patient is on full dose anticoagulation with warfarin.  11.  CODE STATUS:  DISCUSSED WITH THE PATIENT'S HEALTHCARE POWER OF ATTORNEY, MS. CATHY REDER, AND SHE CONFIRMS THE PATIENT IS A FULL CODE.   Total time spent on admission and patient care 55 minutes.     ____________________________ Albertine Patricia,  MD dse:ea D: 03/16/2014 02:27:28 ET T: 03/16/2014 03:59:01 ET JOB#: [id number]  cc: Albertine Patricia, MD, <Dictator> Janelys Glassner Graciela Husbands MD ELECTRONICALLY SIGNED 03/16/2014 23:59

## 2015-02-22 NOTE — Discharge Summary (Signed)
PATIENT NAME:  [name], [name] MR#:  [id number] DATE OF BIRTH:  Nov 19, 1945  DATE OF ADMISSION:  03/07/2014 DATE OF LIKELY DISCHARGE:  03/14/2014   DISCHARGE DIAGNOSES:  1. Acute on chronic respiratory failure, status post BiPAP now.  2. Chronic obstructive pulmonary disease flare causing above.  3. Encephalopathy from above.  4. End-stage renal disease, on dialysis.  5. Sepsis syndrome secondary to respiratory failure and acute respiratory acidosis.  6. History of pulmonary emboli, on warfarin.  7. Atrial fibrillation with rapid rate, controlled on metoprolol.  8. Multiple renal cell cancers, possibly new renal mass on the right.   DISCHARGE MEDICATIONS: Per [facility] med reconciliation system. Briefly, should be on a prednisone taper. She has finished her antibiotics. She will be on metoprolol, continue her warfarin, which needs to be followed closely.   HISTORY AND PHYSICAL: Please see detailed history and physical done on admission.   HOSPITAL COURSE: The patient was admitted on BiPAP, hypotensive in ICU. She improved with the addition of steroids, antibiotics, nebulized bronchodilators, etc. She was still weak and slow to come around. She will go back to her skilled facility for further rehab and care where she has been. INR was 2.4 today. Ultrasound did show a possible mass in the right kidney which was only a remnant of that post multiple surgeries. [facility]. He thinks this may be an organized hematoma, but she does need to see him soon for further confirmation of this. She understands this and so does [facility], and please help her make this appointment. Her pH on admission was 7.16, pCO2 was 86, secondary to wheezing, COPD flare, acute respiratory failure as noted. This improved slowly, 7.27 by May 7th. She had a mild anemia that likely corresponded to her end-stage renal disease. She will get Epogen and have this managed further by nephrology on her outpatient  dialysis regimen. She understood this. Willing and ready for discharge today. Followup as noted. I suspect she may need long-term skilled care if she ultimately cannot rehab back home or to assisted living.   TIME SPENT: Please note, it took approximately 35 minutes to do the discharge tasks today.   ____________________________ Marya AmslerMarshall W. Dareen PianoAnderson, MD mwa:lb D: 03/14/2014 08:08:08 ET T: 03/14/2014 08:16:14 ET JOB#: [id number]  cc: Marya AmslerMarshall W. Dareen PianoAnderson, MD, <Dictator> Lauro RegulusMARSHALL W Dalinda Heidt MD ELECTRONICALLY SIGNED 03/15/2014 7:19

## 2015-02-22 NOTE — Consult Note (Signed)
PATIENT NAME:  [name], [name] MR#:  [id number] DATE OF BIRTH:  15-Apr-1946  DATE OF CONSULTATION:    REFERRING PHYSICIAN:   CONSULTING PHYSICIAN:  Marcina MillardAlexander Benicia Bergevin, MD  PRIMARY CARE PHYSICIAN:  Dr. Dareen PianoAnderson.   CHIEF COMPLAINT:  Weakness.   REASON FOR CONSULTATION:  Consultation requested for evaluation of atrial fibrillation.   HISTORY OF PRESENT ILLNESS:  The patient is a 69 year old female with known history of end-stage renal disease on chronic hemodialysis who was admitted on 03/07/2014 for generalized weakness, increasing confusion and [facility] from [facility].  The patient was noted to be hypotensive and hypoxic, treated with BiPAP and removal of a drain status post recent surgery for bilateral renal cell cancer.  Today, the patient was undergoing dialysis and experienced atrial fibrillation with a rapid ventricular rate.  The patient was given a dose of metoprolol.  The patient denies chest pain or palpitations, though she has been told in the past that she does have episodes of tachycardia.   PAST MEDICAL HISTORY:  1.  Bilateral renal cell carcinoma.  2.  End-stage renal disease on chronic hemodialysis.  3.  Hypertension.  4.  Diabetes.  5.  COPD.  6.  History of pulmonary embolus.   MEDICATIONS:  Lisinopril 5 mg daily, Coumadin 4 mg daily, Cortef 10 mg 2 tabs q.a.m., Dilaudid 2 mg q. 3 hours as needed, Tylenol 325 mg 2 tabs q. 6 as needed, gabapentin 100 mg at bedtime, Zyrtec 10 mg daily, metoprolol 25 mg twice daily, Advair 250/50 twice daily, DuoNebs q. 6 as needed, Lidoderm 5% topical patch daily, Epogen 4000 units IV on dialysis days, rimantadine 150 mg daily, senna S 50/8.6 mg 2 tabs twice daily, oxybutynin 5 mg q. 8 hours as needed.   SOCIAL HISTORY:  The patient currently at [facility].  She has a past history of tobacco abuse.   FAMILY HISTORY:  No immediate family history of coronary artery disease or  myocardial infarction.   REVIEW OF SYSTEMS:  CONSTITUTIONAL:  The patient had generalized weakness.  EYES:  No blurry vision.  EARS:  No hearing loss.  RESPIRATORY:  The patient has had some mild exertional dyspnea.  CARDIOVASCULAR:  The patient denies chest pain or palpitations.  GASTROINTESTINAL:  No nausea, vomiting, or diarrhea.  GENITOURINARY:  No dysuria or hematuria.  ENDOCRINE:  No polyuria or polydipsia.  MUSCULOSKELETAL:  No arthralgias or myalgias.  NEUROLOGICAL:  No focal muscle weakness or numbness.  PSYCHOLOGICAL:  No depression or anxiety.   PHYSICAL EXAMINATION: VITAL SIGNS:  Blood pressure 109/67, pulse 106 and irregular, respirations 24, temperature 97.5, pulse ox 94%.  HEENT:  Pupils equal, reactive to light and accommodation.  NECK:  Supple without thyromegaly.  LUNGS:  Clear.  HEART:  Normal JVP.  Normal PMI.  Irregularly irregular rhythm.  Normal S1, S2.  No appreciable gallop, murmur, or rub.  ABDOMEN:  Soft and nontender.  Pulses were intact bilaterally.  MUSCULOSKELETAL:  Normal muscle tone.  NEUROLOGIC:  The patient is alert and oriented x 3.  Motor and sensory both grossly intact.   IMPRESSION:  A 69 year old female with multiple surgeries for bilateral renal cell carcinoma with end-stage renal disease on chronic hemodialysis who experienced an episode of atrial fibrillation during dialysis, though was asymptomatic.  The patient given a dose of metoprolol.  Currently appears clinically stable.  Heart rate under control.   RECOMMENDATIONS: 1.  Agree with overall current therapy.  2.  Continue with increased dose of metoprolol tartrate 50 mg twice daily.  3.  Defer further cardiac diagnostics at this time.  4.  Further recommendations pending the patient's clinical course on higher dose of metoprolol.     ____________________________ Marcina Millard, MD ap:ea D: 03/12/2014 16:47:47 ET T: 03/12/2014 17:15:14 ET JOB#: [id number]  cc: Marcina Millard, MD, <Dictator> Marcina Millard MD ELECTRONICALLY SIGNED 04/01/2014 15:03

## 2015-02-22 NOTE — Consult Note (Signed)
PATIENT NAME:  [name], [name] MR#:  [id number] DATE OF BIRTH:  25-Sep-1946  DATE OF CONSULTATION:  01/14/2014  CONSULTING PHYSICIAN:  Caralyn Guileichard D. Edwyna ShellHart, DO  This is a patient I saw in ICU today at the behest of her internist. Dr. Einar CrowMarshall Anderson is the provider. This patient is a very complicated patient who was admitted last night with sepsis. She has had a long urologic history and been treated at [facility] by Dr. Merilynn Finlandobertson. She has refused to go back to [facility] because she did not like the nursing care.  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 She has had a left total nephrectomy and several partial right nephrectomies so that she only now has one third of her kidney left. After her last surgery, she developed a wound infection. This has been a recent surgery. I believe this was a surgery probably done in February, as she still has staples in her wound incision area. She then developed a wound infection after this and has been [facility] and [facility] with Dr. Merilynn Finlandobertson. She had a temperature of 100.5, blood pressure 94/52, but blood cultures at this point are negative. She had dialysis on Thursday and Saturday, and after dialysis she felt nauseous, sick, and dizzy. Not accompanied by any vomiting. She has had a nephrostomy tube placed. She was transferred because of problems with the nephrostomy tube.   PAST MEDICAL HISTORY: Includes pulmonary emboli, right DVT, neuropathy, COPD, steroid dependency, hypertension, chronic pain, and diet-controlled diabetes.   SHE IS ALLERGIC TO CIPRO, IODINE, NSAIDS, PENICILLIN, PHENOBARBITAL, SULFA, TAPE, AND CANNOT HAVE CONTRAST.   She has had a left nephrectomy, left adrenalectomy, partial right nephrectomy with adrenal left in, history of cesarean sections, tonsillectomy.   She is widowed. Does not smoke or drink.   REVIEW OF SYSTEMS:  CONSTITUTIONAL: At the present time,  the patient has no vomiting, pain or fever.  EYES: No blurred vision. EARS, NOSE, AND THROAT: She hears well, speaks well.  RESPIRATORY: No cough, wheezing, trouble breathing,  CARDIOVASCULAR: No chest pain or nighttime paroxysmal dyspnea. GENITOURINARY: She has multiple problems. She cannot void much. She has good output from her nephrostomy tube, and I am wondering if she did not have a leak from her calyxes after the partial nephrectomies.  INTEGUMENTARY: She has no skin rashes.  MUSCULOSKELETAL: No joint pains.  PSYCHIATRIC: She does not have a lot of energy.  MEDICATIONS: Advair Diskus 250/50 puff Becker.i.d., Cortef 10 mg 2 tabs in the morning and 1 at night, Coumadin 4 mg once a day, Dilaudid 2 mg t.i.d., DuoNebs every 6 hours, Epogen 4000 units with dialysis, Neurontin 100 mg daily, Lidoderm patch to affected painful areas, metoprolol  25 mg every 12 hours, lisinopril 5 mg p.o. daily, oxybutynin 5 mg every 8 hours (I cannot imagine what that is for because she does not really have anything in her bladder), ranitidine 150 mg daily, senna 2 tablets 2 times a day, and Zyrtec 10 mg once a day.   PHYSICAL EXAMINATION:   VITAL SIGNS: Stable. GENERAL: She is alert and oriented x 3 and quite talkative.  PSYCHIATRIC: She also has some depression, some denial. She has been having some problems dealing with the recent death of her husband and her son.  HEENT: Her head is atraumatic, normocephalic. Pupils are equal and reactive to light.  HEART: Heart sounds are regular in rate and rhythm.  LUNGS:  Clear to auscultation. ABDOMEN: Bowel sounds present. Abdomen soft. I checked her wound, took off her dressings. She has not had any drainage. She has some what looks like drain in her wound. The staples are still intact. Nephrostomy tube is draining well. There is no pus or blood in the nephrostomy tube.  MUSCULOSKELETAL: All extremities are movable, but she is overweight.  PSYCHIATRIC: Mood and affect show  depression and flights of disconnection from her own problems into her life with her family.   PLAN: Is to check her nephrostomy tube and do a nephrostogram, which I have ordered and will get it done tonight I guess according to interventional radiology, and then see if it can be removed. I also recommend highly that this patient gets transferred to [facility]. She does not belong here, she is too complicated, and she is going to need dialysis there. She will also need to have her wound infection taken care of and her general overall health taken care of by the physicians who have been caring for her for many, many years. Her problem with [facility] was not the physicians. It was the floor care on one particular floor. It is really unfortunate because she is not a patient that we can easily take care of here.   ____________________________ Caralyn Guile. Edwyna Shell, DO rdh:jcm D: 01/14/2014 17:10:25 ET T: 01/14/2014 20:41:51 ET JOB#: [id number]  cc: Caralyn Guile. Edwyna Shell, DO, <Dictator> RICHARD D HART DO ELECTRONICALLY SIGNED 01/17/2014 17:35

## 2015-02-22 NOTE — Consult Note (Signed)
PATIENT NAME:  [name], RIGHTMYER MR#:  [id number] DATE OF BIRTH:  [birth year]  DATE OF CONSULTATION:  01/14/2014  REFERRING PHYSICIAN:  Epifanio Lesches, MD CONSULTING PHYSICIAN:  Darrielle Pflieger Lilian Kapur, MD  REASON FOR CONSULTATION: Evaluation and management of end-stage renal disease.   HISTORY OF PRESENT ILLNESS: The patient is a pleasant 69 year old Caucasian female with past medical history of pulmonary embolism, COPD, hypertension, chronic pain, diet controlled diabetes mellitus, left nephrectomy, partial right nephrectomy, and renal cell carcinoma who presented to [facility] with temperature of 100.5 and low blood pressure. The patient has a complex past medical history. She has had history of renal cell carcinoma. She has a history of nephrectomy some years ago, on the left. She was recently found to have multiple right renal masses. She had partial nephrectomy for this and reports to me that she only has one-third of her right kidney left. She has a history of nephrostomy placement in the right kidney. Most of her urologic procedures have occurred at [facility], and she is followed by Dr. Alford Highland there. However, after her recent right-sided partial nephrectomy, she has been on dialysis. She started outpatient hemodialysis here in [place] on January 10, 2014. Her estimated dry weight is 97.2. She goes to dialysis on Tuesday, Thursday, and Saturday. She has a right IJ PermCath in place. She also has maturing access in the left upper extremity. She states that she has been tolerating dialysis relatively well thus far. Blood cultures thus far are negative. She is having urine output from the right-sided nephrostomy.   PAST MEDICAL HISTORY: 1.  COPD. 2.  Hypertension.  3.  Chronic pain.  4.  Diet-controlled diabetes.  5.  Pulmonary embolism.   PAST SURGICAL HISTORY: 1.  Left nephrectomy.  2.  Left adrenalectomy.  3.  Partial right nephrectomy for renal  cell carcinoma.  4.  C-section.  5.  Tonsillectomy.   ALLERGIES: CIPRO, IODINE, NSAIDS, PENICILLIN, PHENOBARBITAL, SULFA DRUGS.  CURRENT INPATIENT MEDICATIONS: Include: 1.  Norepinephrine drip.  2.  Normal saline 0.9 at 30 mL/h.  3.  Acetaminophen 650 mg p.o. q. 4 hours p.r.n.  4.  Alprazolam 0.5 mg p.o. q. 8 hours p.r.n. anxiety.  5.  Hydrocortisone 100 mg IV q. 8 hours.  6.  Dilaudid 2 mg p.o. q. 4 hours p.r.n. pain.  7.  Zofran 4 mg IV q. 4 hours p.r.n.  8.  Vancomycin 1 gram IV x1.  9.  Warfarin 5 mg p.o. q. 11:00 a.m.  10.  Levofloxacin 500 mg IV q. 48 hours.   SOCIAL HISTORY: The patient currently resides at [facility], but lives in [place]. She is widowed. She has 1 son of her own. She denies tobacco, alcohol, or illicit drug use.   FAMILY HISTORY: The patient states that her mother died at an advanced age. Her father unfortunately had a traumatic fire injury and died secondary to this.  REVIEW OF SYSTEMS: CONSTITUTIONAL: The patient has had low-grade fever. Currently denies chills. Reports diminished appetite.  EYES: Denies diplopia, blurry vision.  HEENT: Denies headaches, hearing loss. Denies epistaxis.  CARDIOVASCULAR: Denies chest pain, palpitations, PND.  RESPIRATORY: Endorses a cough, but denies shortness of breath or hemoptysis.  GASTROINTESTINAL: Denies nausea or vomiting at present, but had some yesterday. Reports diminished appetite.  GENITOURINARY: Denies frequency or urgency. Has history of renal cell carcinoma and is status post left nephrectomy and partial right nephrectomy.  MUSCULOSKELETAL: Has history of chronic pain.  INTEGUMENTARY: Denies skin  rashes or lesions.  NEUROLOGIC: Denies focal weakness. Does have history of peripheral neuropathy.  PSYCHIATRIC: Denies depression, bipolar disorder.  ENDOCRINE: Denies polyuria or polydipsia.  HEMATOLOGIC AND LYMPHATIC: Denies easy bruisability, bleeding, or swollen lymph nodes.  ALLERGY AND IMMUNOLOGIC:  Denies seasonal allergies. There is history of immunodeficiency.   PHYSICAL EXAMINATION: VITAL SIGNS: Temperature 98.2, pulse 82, respirations 20, blood pressure 124/55, pulse ox 98% on 2 liters.  GENERAL: Well-developed, well-nourished, obese female, currently in no acute distress.  HEENT: Normocephalic, atraumatic. Extraocular movements are intact. Pupils are equal, round, and reactive to light. No scleral icterus. Conjunctivae are pale. No epistaxis noted. Gross hearing intact. Oral mucosa moist. There is oral thrush noted on the hard palate.  NECK: Supple without JVD or lymphadenopathy. There is a right internal jugular PermCath in place.  LUNGS: Clear to auscultation bilateral with normal respiratory effort. CARDIOVASCULAR: S1 and S2 regular rate and rhythm. No murmurs, rubs, or gallops appreciated. ABDOMEN: Soft, nontender, nondistended. Bowel sounds positive. No rebound or guarding. No gross organomegaly appreciated.  EXTREMITIES: No clubbing, cyanosis, or edema.  NEUROLOGIC: The patient is awake, alert, and oriented to time, person, and place. Strength is 5 out of 5 in both upper and lower extremities.  SKIN: Warm and dry. No rashes noted.  GENITOURINARY: No suprapubic tenderness is noted at this time.  MUSCULOSKELETAL: No joint redness, swelling, or tenderness appreciated.  PSYCHIATRIC: The patient with appropriate affect and appears to have very good insight into her current illness.   DIAGNOSTIC DATA: Laboratory: CBC shows WBC 6.4, hemoglobin 7.9, hematocrit 24, platelets 224,000, MCV 94. BMP shows sodium 133, potassium 3.9, chloride 97, CO2 31, BUN 23, creatinine 3.82, glucose 140. EGFR 11. INR 1.6. Troponin 0.14. Blood cultures x2 sets are thus far negative.   Chest x-ray shows borderline cardiomegaly with vascular congestion.   IMPRESSION: This is a 69 year old Caucasian female with complicated past medical history including renal cell carcinoma status post left and partial right  nephrectomy, hypertension, chronic pain syndrome, steroid dependency, chronic obstructive pulmonary disease, diet-controlled diabetes, left adrenalectomy, history of pulmonary embolism, end-stage renal disease on hemodialysis Tuesday, Thursday, and Saturday, and anemia of chronic kidney disease who presented to [facility] with fevers and hypotension.  1.  End-stage renal disease on hemodialysis Tuesday, Thursday, and Saturday. The patient has recently started outpatient dialysis on January 10, 2014 at the [facility] and is currently followed by [facility] nephrology. At this point in time, we will continue her on hemodialysis and we plan to perform this tomorrow as there is no acute indication today. We will use her right IJ PermCath. She does have a left upper extremity access in place; however, this is not yet mature. Her estimated dry weight at present is listed as 97.2; however, the patient appears to be a good bit above this at present.  2.  Anemia of chronic kidney disease. The patient's hemoglobin has drifted down to 7.9. Since she has a history of renal masses it appears that Procrit has been avoided. We will avoid this for now. We would recommend blood transfusion for hemoglobin of 7 or less.  3.  Renal cell carcinoma with history of left nephrectomy and partial right nephrectomy. She has been followed by urology at [facility]. There is urine being produced from her nephrostomy now. However, the long-term plan is in question at present. We recommend urology consultation here. If further urologic intervention is required, the patient may seek care at [facility]. 4.  Fever/hypotension. The etiology of her fever and hypotension are currently unclear. Agree with broad-spectrum antibiotics for now and also agree with obtaining a urinalysis for further evaluation. Would also consider CT scan of the abdomen and pelvis without contrast for further evaluation; however, I  defer this to the primary team.   I would like to thank Dr. Vianne Bulls for this kind referral. Further plan as the patient progresses.  ____________________________ Tama High, MD mnl:sb D: 01/14/2014 09:14:04 ET T: 01/14/2014 11:06:39 ET JOB#: [id number]  cc: Tama High, MD, <Dictator> Tama High MD ELECTRONICALLY SIGNED 01/29/2014 8:54

## 2015-02-22 NOTE — H&P (Signed)
PATIENT NAME:  [name], [name] DATE OF BIRTH:  Mar 07, 1946  DATE OF ADMISSION:  03/07/2014  REFERRING PHYSICIAN:  Dr. Carollee MassedKaminski.   PRIMARY CARE PHYSICIAN:  Dr. [facility]Anderson [facility].   CHIEF COMPLAINT:  Low oxygen.   HISTORY OF PRESENT ILLNESS:  A 69 year old Caucasian female with past medical history of end-stage renal disease on hemodialysis Tuesday, Thursday, Saturday as well as PE, COPD, hypertension, diabetes, who is presenting with low oxygen.  The patient is unable to provide any meaningful information secondary to confusion.  She lives at [facility] residence per documentation noted in the nursing facility to have SaO2 of 82% on room air.  She was brought to the hospital, states that "I feel fine, I am always like this."  While in the Emergency Department also noted to become hypotensive, had a central line placed, blood cultures obtained.  Once again, the patient is unable to provide any meaningful information.   REVIEW OF SYSTEMS:  Unable to obtain secondary to the patient's current mental status.   PAST MEDICAL HISTORY:  End-stage renal disease on hemodialysis, history of PE, on warfarin, COPD, hypertension, diabetes.   SOCIAL HISTORY:  No documentation of alcohol, tobacco or drug usage.  Currently resides at [facility] nursing facility.   FAMILY HISTORY:  Positive for hypertension and diabetes.   ALLERGIES:  CIPRO, IODINE, NSAIDS, PENICILLIN, PHENOBARBITAL, SULFA DRUGS AND TAPE.   HOME MEDICATIONS:  Include Cortef 10 mg 2 tabs in the morning, 1 tab in the evening, Dilaudid 2 mg by mouth every three hours as needed for pain, Tylenol 325 mg 2 tabs every six hours as needed for pain, lisinopril 5 mg by mouth daily, Coumadin 4 mg by mouth daily, gabapentin 100 mg by mouth at bedtime, Zyrtec 10 mg by mouth daily, metoprolol 25 mg by mouth twice daily, Advair 250/50 mcg inhalation twice daily, DuoNeb treatments 3 mL every six hours as needed for shortness of  breath, Lidoderm 5% topical patch once daily, Epogen 4000 units IV on dialysis days, rimantadine 150 mg by mouth daily, Senna-S 50/8.6 mg 2 tabs twice daily, oxybutynin 5 mg by mouth q. 8 hours as needed for overactive bladder.    PHYSICAL EXAMINATION: VITAL SIGNS:  Temperature 98.2, heart rate 96, respiratory rate 24, initial blood pressure 96/51, currently 82/36, saturating 82% on room air.  Currently saturating 97% on 4 liters nasal cannula.  Weight 99.8 kg.  BMI 40.3.  GENERAL:  Chronically ill-appearing Caucasian female who is in minimal to moderate distress secondary to respiratory status as well as mental status.  HEAD:  Normocephalic, atraumatic.  EYES:  Pupils equal, round, reactive to light.  Extraocular muscles intact.  No scleral icterus.  MOUTH:  Dry mucosal membrane.  Dentition intact.  No abscess noted.  EAR, NOSE, THROAT:  Throat clear without exudates.  No external lesions.  NECK:  Supple.  No thyromegaly.  No adenopathy.  No JVD.  PULMONARY:  Diminished breath sounds throughout all lung fields without frank wheezes, rubs or rhonchi.  No use of accessory muscles, though tachypnea.  Good respiratory effort.  CHEST:  Nontender to palpation.  CARDIOVASCULAR:  S1, S2, tachycardic.  No murmurs, rubs, or gallops.  1 to 2+ edema bilaterally throughout the entire lower extremity.  Pedal pulses 2+ bilaterally.  GASTROINTESTINAL:  Soft, nontender, nondistended.  No masses.  Positive bowel sounds.  No hepatosplenomegaly.  MUSCULOSKELETAL:  No swelling, clubbing, or edema.  Range of motion full in all extremities.  NEUROLOGIC:  Cranial nerves II through XII intact.  No gross focal neurological deficits.  Sensation intact.  Reflexes intact.  SKIN:  No ulcerations, lesions, rash, cyanosis.  Skin warm, dry.  Turgor intact.  PSYCHIATRIC:  Unable to fully obtain secondary to the patient's mental status.  Her mood and affect blunted.  She is somnolent, arousable, but confused.  Insight and judgment  poor.   LABORATORY DATA:  Sodium 139, potassium 3.9, chloride 101, bicarb 34, BUN 24, creatinine of 3.33, glucose 76.  LFTs:  Total protein 5, albumin 1.8, otherwise within normal limits.  Troponin I 0.64.  WBC 12, hemoglobin 8.3, platelets 360.  INR of 1.8.  Chest x-ray performed revealing bibasilar pleural effusions with atelectasis.  ASSESSMENT AND PLAN:  A 70 year old Caucasian female with history of end-stage renal disease on hemodialysis Tuesday, Thursday, Saturday as well as pulmonary embolus and chronic obstructive pulmonary disease, presenting with low oxygen.  1.  Acute respiratory failure with hypoxemia indicated by respiratory rate and oxygen saturation on arrival.  We will provide supplemental oxygen to keep oxygen saturation greater than 92%.  DuoNebs every 4 hours as well as incentive spirometry.  2.  Sepsis:  Meeting septic criteria by leukocytosis, respiratory rate and heart rate of unclear etiology.  We will panculture including blood and urine.  Broad-spectrum antibiotic coverage with vancomycin and meropenem.  Given allergies, she may require pressor therapy.  3.  Elevated troponin.  We will trend cardiac enzymes x 3, place on telemetry.  4.  End-stage renal disease on hemodialysis.  We will consult nephrology for continuation of dialysis.  No acute indication for dialysis at this time.  5.  History of pulmonary embolism, on warfarin therapy, currently subtherapeutic at 1.8.  We will continue warfarin.  6.  Venous thromboembolism prophylaxis.  She will continue on her warfarin, provide sequential compression devices while she is subtherapeutic.  7.  CODE STATUS:  THE PATIENT IS A FULL CODE.   Critical care time spent 45 minutes.    ____________________________ Cletis Athens. Kenlee Vogt, MD dkh:ea D: 03/07/2014 02:07:51 ET T: 03/07/2014 04:00:00 ET JOB#: [id number]  cc: Cletis Athens. Anoop Hemmer, MD, <Dictator> Britten Parady Synetta Shadow MD ELECTRONICALLY SIGNED 03/08/2014 1:02

## 2015-02-22 NOTE — Op Note (Signed)
PATIENT NAME:  [name], [name] MR#:  [id number] DATE OF BIRTH:  [birth year]  DATE OF PROCEDURE:  03/16/2014  PREOPERATIVE DIAGNOSIS: Pneumonia and respiratory failure.   POSTOPERATIVE DIAGNOSIS: Pneumonia and respiratory failure.   PROCEDURE: Attempted central line placement.   SURGEON: Dionne Miloichard Mylan Schwarz, MD  ANESTHESIA: Local.   INDICATIONS: This patient discharged yesterday who returned less than 24 hours with worsening respiratory failure, return from a nursing home. Dr. Randol KernElgergawy called me stating that she had just had a central line removed from her right femoral and that the only remaining site is her left femoral because of her upper IV access needs for dialysis. This was an emergent procedure. Dr. Randol KernElgergawy had obtained consent from the family and asked me to place this line.   DESCRIPTION OF PROCEDURE: The patient was identified and discussed the options and rationale. She was then prepped and draped in a sterile fashion. Cap, mask, and gown were utilized. Palpation of the left groin caused the patient to wail and move about claiming that she was in pain prior to any portion of the procedure other than palpation of the groin. This was reproduced by palpation of the right groin as well.   Palpation of the left femoral artery was performed. Local anesthetic was infiltrated after she had been prepped and draped in a sterile fashion. Multiple attempts at cannulating the left femoral vein were performed. The femoral vein could not be adequately cannulated to allow passage of the Seldinger wire. The left femoral area was abandoned after multiple attempts.   Re prepping and draping was performed on the right side where she had had a prior central line removed previously. Palpation of the right femoral artery was not possible. No palpable pulse was felt beneath a rather large hematoma and ecchymosis but near the site of the previous central line multiple attempts were made at passage of the large-bore  needle and no flash of blood could be identified. The central line process was abandoned after multiple attempts in both the right and left femoral areas. The patient was left in stable condition and Dr. Randol KernElgergawy was notified by nursing.    ____________________________ Adah Salvageichard E. Excell Seltzerooper, MD rec:lt D: 03/16/2014 04:15:18 ET T: 03/16/2014 04:59:05 ET JOB#: [id number]  cc: Adah Salvageichard E. Excell Seltzerooper, MD, <Dictator> Lattie HawICHARD E Pearlee Arvizu MD ELECTRONICALLY SIGNED 03/16/2014 81:1920:08

## 2015-02-22 NOTE — Discharge Summary (Signed)
PATIENT NAME:  [name], [name] MR#:  [id number] DATE OF BIRTH:  1946-01-27  DATE OF ADMISSION:  03/16/2014 DATE OF DISCHARGE: 03/27/2014   DISCHARGE DIAGNOSES:  1.  Acute on chronic respiratory failure.  2.  Pneumonia.  3.  Chronic obstructive pulmonary disease flare.  4.  Metabolic encephalopathy, which has improved.  5.  End-stage renal disease with massive edema and anasarca, which has improved with dialysis.  6.  History of pulmonary emboli.  7.  Atrial fibrillation, rate is controlled on amiodarone.  8.  Multiple renal cell cancers with a recent new mass in the right kidney, which needs follow up soon. There is Clostridium difficile colitis.  9.  Leukocytosis, likely secondary to Clostridium difficile.  10. Poor overall status, needs transfer to long-term acute care.   DISCHARGE MEDICATIONS: Advair 250/50, 1 puff b.i.d.; Merrem 500 mg daily for 3 more days recommended; metoprolol 25 mg p.o. b.i.d.; Midodrine 5 mg p.o. t.i.d.; prednisone 20 mg daily which should be slowly tapered to off over the course of the next 2 weeks per my recommendation, then can go to her usual Cortef 20 mg in the a.m. and 10 mg in the p.m.; amiodarone 200 mg b.i.d. for another week then decrease to daily as needed for her atrial fibrillation; anusol hc suppository q.8 hours p.r.n.; Flagyl 500 mg p.o. q.8 hours for Clostridium difficile, recommend 10 days worth, which will overlap Merrem for a week; warfarin 1.5 mg p.o. daily and follow pro times closely given her multiple new medications that interact with this; tramadol 25 mg q.8 hours p.r.n., but need to be very careful of sedation as sedating medications seem to cause respiratory issues quickly with her; Combivent 1 puff q.i.d.; Renvela 1600 mg t.i.d.   HISTORY AND PHYSICAL: Please see detailed history and physical done on admission.   HOSPITAL COURSE: The patient was admitted acutely ill on BiPAP with respiratory failure as noted above. She slowly improved with the  addition of steroids, bronchodilators, antibiotics for pneumonia as well. She is down to her usual nasal cannula oxygen at this point and doing much better, although she is very weak and debilitated. She had a recent discharge to skilled nursing facility, but did not make there more than 24 to 48 hours and came back with massive edema as noted. All this is much improved, but given her weak, dilatated and very frail status, transfer to LTAC was deemed appropriate. Her recent labs include a white count of 18,600, hemoglobin of 11,000 and platelets 250. Her white count has been stable in this range for some time. Her INR is 2.4. Again, her warfarin dose has been changed multiple times recently and this should be followed closely. She has been dialyzed by nephrology, which needs to be continued at their discretion. C. difficile was positive on May 22 for an antigen. Her diarrhea has improved at this point. Her last chest x-ray on the 15th  showed right middle lobe consolidation, likely pneumonia. Again, a recent ultrasound of her kidneys did show a recurrent mass in the right kidney. [place] urolog[place] in [place] was not convinced this was recurrent carcinoma thought it could possibly be organized hematoma. However, this, in my opinion, needs to be investigated further relatively quickly to avoid metastatic disease as well as to ensure this is not an infected area as well. Might repeat ultrasound to see if it is gone to begin with before proceeding to CT and/or CT biopsies.   It took approximately 35 minutes to do  all discharge tasks today.  ____________________________ Marya Amsler. Dareen Piano, MD mwa:aw D: 03/27/2014 07:22:47 ET T: 03/27/2014 07:34:25 ET JOB#: [id number]  cc: Marya Amsler. Dareen Piano, MD, <Dictator> Lauro Regulus MD ELECTRONICALLY SIGNED 03/27/2014 11:30

## 2015-02-22 NOTE — Discharge Summary (Signed)
PATIENT NAME:  [name], [name] MR#:  [id number] DATE OF BIRTH:  Nov 15, 1945  DATE OF ADMISSION:  01/13/2014 PLANNED DATE OF DISCHARGE:  01/18/2014   DISCHARGE DIAGNOSES: 1.  Sepsis, likely source from the kidney.  2.  Status post complete nephrectomy and recent contralateral partial nephrectomy with small amount of kidney left that is drained through a nephrostomy tube.   DISCHARGE MEDICATIONS: Tylenol p.r.n., Xanax 0.5 q.8 p.r.n., Dilaudid 2 mg q.4 hours p.r.n. pain, Allegra 180 mg daily, Zofran 4 mg q.4 hours p.r.n. nausea. She has a central line that was flushed with normal saline per protocol as well as heparin flush per protocol. Albuterol oral 2 puffs q.4 hours p.r.n., Advair 250/50 one puff Becker.i.d., nystatin suspension 5 mL q.6 hours, warfarin 4 mg daily (should be followed closely with pro times, as she was less than therapeutic on admission but then became rapidly supratherapeutic on 5 mg and had been on 4 mg prior to admission), levofloxacin 500 mg q. 48 hours, hydrocortisone 50 mg IV q.8 hours (which should be gradually decreased to a more physiologic dose between 20 and 40 mg daily, likely in divided doses, as her medical situation improves). Notably, she had been on IV vancomycin dosed in dialysis. As  all cultures are negative to date, this can be weaned off per new attending's discretion.  HISTORY AND PHYSICAL: Please see detailed history and physical done on admission. Briefly, had been admitted post prolonged [facility] hospital stay, post partial nephrectomy as noted complicated by sepsis, acute renal failure, etc.   HOSPITAL COURSE: Admitted here, as above, post discharge to skilled facility for a few days. She was felt to be septic on admission, likely urinary source, though culture and urinalysis was not done until after antibiotics were started. Urinalysis was positive, cultures negative. No other source was found. Blood cultures were negative. She improved clinically with IV fluid  resuscitation and antibiotics. Dialysis was continued in the hospital. It does not seem that a small amount of functioning kidney will support her off of dialysis. On admission, it was strongly recommended to the patient she go back to [facility], where here urologists were who knew the situation well, and the other physicians who had been treating her carefully throughout a prolonged stay there. However, she declined this and insisted on staying in the hospital. This was per the Emergency Room doctors and the admitting hospitalist. I saw her and reiterated this the following couple of days. She still refused transfer at that time. Luckily, she did continue to improve and is stable now. It does seem like her kidney function will not improve to the point that her little bit of anatomical kidney left, that is being drained with her nephrostomy tube, will support her. Therefore, since this is tending to get infected, possibly leaking urine into the retro-abdominal space versus subcutaneous tissue versus abdomen itself, that the kidney should be removed either by vascular supply decrease versus frank surgical excision. The patient understands this and says she cannot get back to [facility] now, as her urologist is not there. She was very reluctant to go to a skilled facility, and she did seem to be too ill for that today, so long-term care facility transfer arrangements are being made. As her urologist gets back in town and is agreeable to help with her procedure, consideration of transfer there should be made.   At this point, her labs showed an INR of 2.3 today, which is stable from the previous day. Creatinine was done  yesterday and was 3.14, which is relatively stable. She is mildly anemic still with a hemoglobin in the 7's. She has no leukocytosis as of the 19th with a white count of 7.5.   She did have some wheezing this morning on forced expiration. We had weaned her Solu-Cortef back down to physiologic doses. Will go up  to the dose as noted above to help with that. She had some cold and shaking as well, which should be helped with the above also.   Urinalysis on the 17th showed 334 white blood cells per high-power field. Again, culture was negative, but this was done after at least 1 complete day of antibiotics. Parathyroid hormone was elevated at 196. Hepatitis Becker antigen was negative, done by nephrology for further work-up. The highest creatinine on 03/17 was 4.42. Her blood cultures were again negative. Troponins were minimally elevated, if at all. Chest x-ray did not show any overt infiltrates or pulmonary edema.   TIME SPENT: Please note, it took approximately 40 minutes to do all discharge tasks today.   ____________________________ Marya Amsler. Dareen Piano, MD mwa:jcm D: 01/18/2014 15:06:53 ET T: 01/18/2014 16:11:05 ET JOB#: [id number]  cc: Marya Amsler. Dareen Piano, MD, <Dictator> Lauro Regulus MD ELECTRONICALLY SIGNED 01/21/2014 7:15

## 2015-02-22 NOTE — Consult Note (Signed)
PATIENT NAME:  [name], [name] MR#:  [id number] DATE OF BIRTH:  05/23/1946  DATE OF CONSULTATION:  01/14/2014  REFERRING PHYSICIAN:  Dr. Luberta MutterKonidena CONSULTING PHYSICIAN:  Lincon Sahlin Lizabeth LeydenN. Alichia Alridge, MD  REASON FOR CONSULTATION: Evaluation and management of end-stage renal disease.   HISTORY OF PRESENT ILLNESS:  See alternate dictation. ____________________________ Lennox PippinsMunsoor N. Daisean Brodhead, MD mnl:sg D: 01/14/2014 08:59:00 ET T: 01/14/2014 10:48:47 ET JOB#: [id number]  cc: Lennox PippinsMunsoor N. Calhoun Reichardt, MD, <Dictator> Lennox PippinsMUNSOOR N Jonavan Vanhorn MD ELECTRONICALLY SIGNED 01/29/2014 8:54

## 2015-02-22 NOTE — Op Note (Signed)
PATIENT NAME:  [name], Shakeria B MR#:  [id number] DATE OF BIRTH:  Jun 26, 1946  DATE OF PROCEDURE:  03/07/2014  PREOPERATIVE DIAGNOSES: 1. Pneumonia.  2. Change in mental status.  3. End-stage renal disease requiring hemodialysis.  4. Elevated troponins.   POSTOPERATIVE DIAGNOSES:  1. Pneumonia.  2. Change in mental status.  3. End-stage renal disease requiring hemodialysis.  4. Elevated troponins.   PROCEDURE: Right femoral triple-lumen central line insertion with ultrasound guidance.   SURGEON:  Renford DillsGregory G. Graelyn Bihl, MD  INDICATIONS: Ms. Langston MaskerMorris is a 69 year old woman who presented to the ER in critical condition. She is obtunded and minimally responsive. She appears to have pneumonia as well as cardiac injury. She has end-stage renal disease. The surgical service was contacted secondary to inability to obtain a peripheral IV and refused to take care of this patient. Therefore, I have been asked to secure appropriate access. The risks and benefits were not able to be discussed as the patient is obtunded. This is an emergency procedure. ER physician on the case concurs.   The patient is supine. She is actually in Trendelenburg. The ultrasound is utilized to verify patency of the femoral vein. Subsequently, the right groin is prepped and draped in a sterile fashion. Ultrasound is then placed in a sterile sleeve. Femoral vein is localized. It is homogeneous echolucent and compressible indicating patency. Image is recorded for the permanent record. Under real-time visualization, a microneedle is inserted into the femoral vein. Microwire is then advanced without difficulty, MicroSheath followed by the J-wire, dilator and then the triple-lumen catheter. All three lumens aspirate easily and flush well. The catheter is then secured to the skin of the thigh with 2-0 silk, and a sterile dressing including a Biopatch is placed. There are no immediate complications.     ____________________________ Renford DillsGregory G.  Roselinda Bahena, MD ggs:sg D: 03/07/2014 01:13:38 ET T: 03/07/2014 06:38:31 ET JOB#: [id number]  cc: Renford DillsGregory G. Laniqua Torrens, MD, <Dictator> Renford DillsGREGORY G Deserai Cansler MD ELECTRONICALLY SIGNED 04/02/2014 12:27

## 2015-12-27 IMAGING — CR DG ABD PORTABLE 1V
2 series · 2 of 2 positions shown · non-contrast
Comparison: CT, [DATE]

RIGHT CLINICAL DATA:
Possibly to leaking.  Periumbilical pain.

EXAM:
PORTABLE ABDOMEN - 1 VIEW

[AP (1 of 2)]
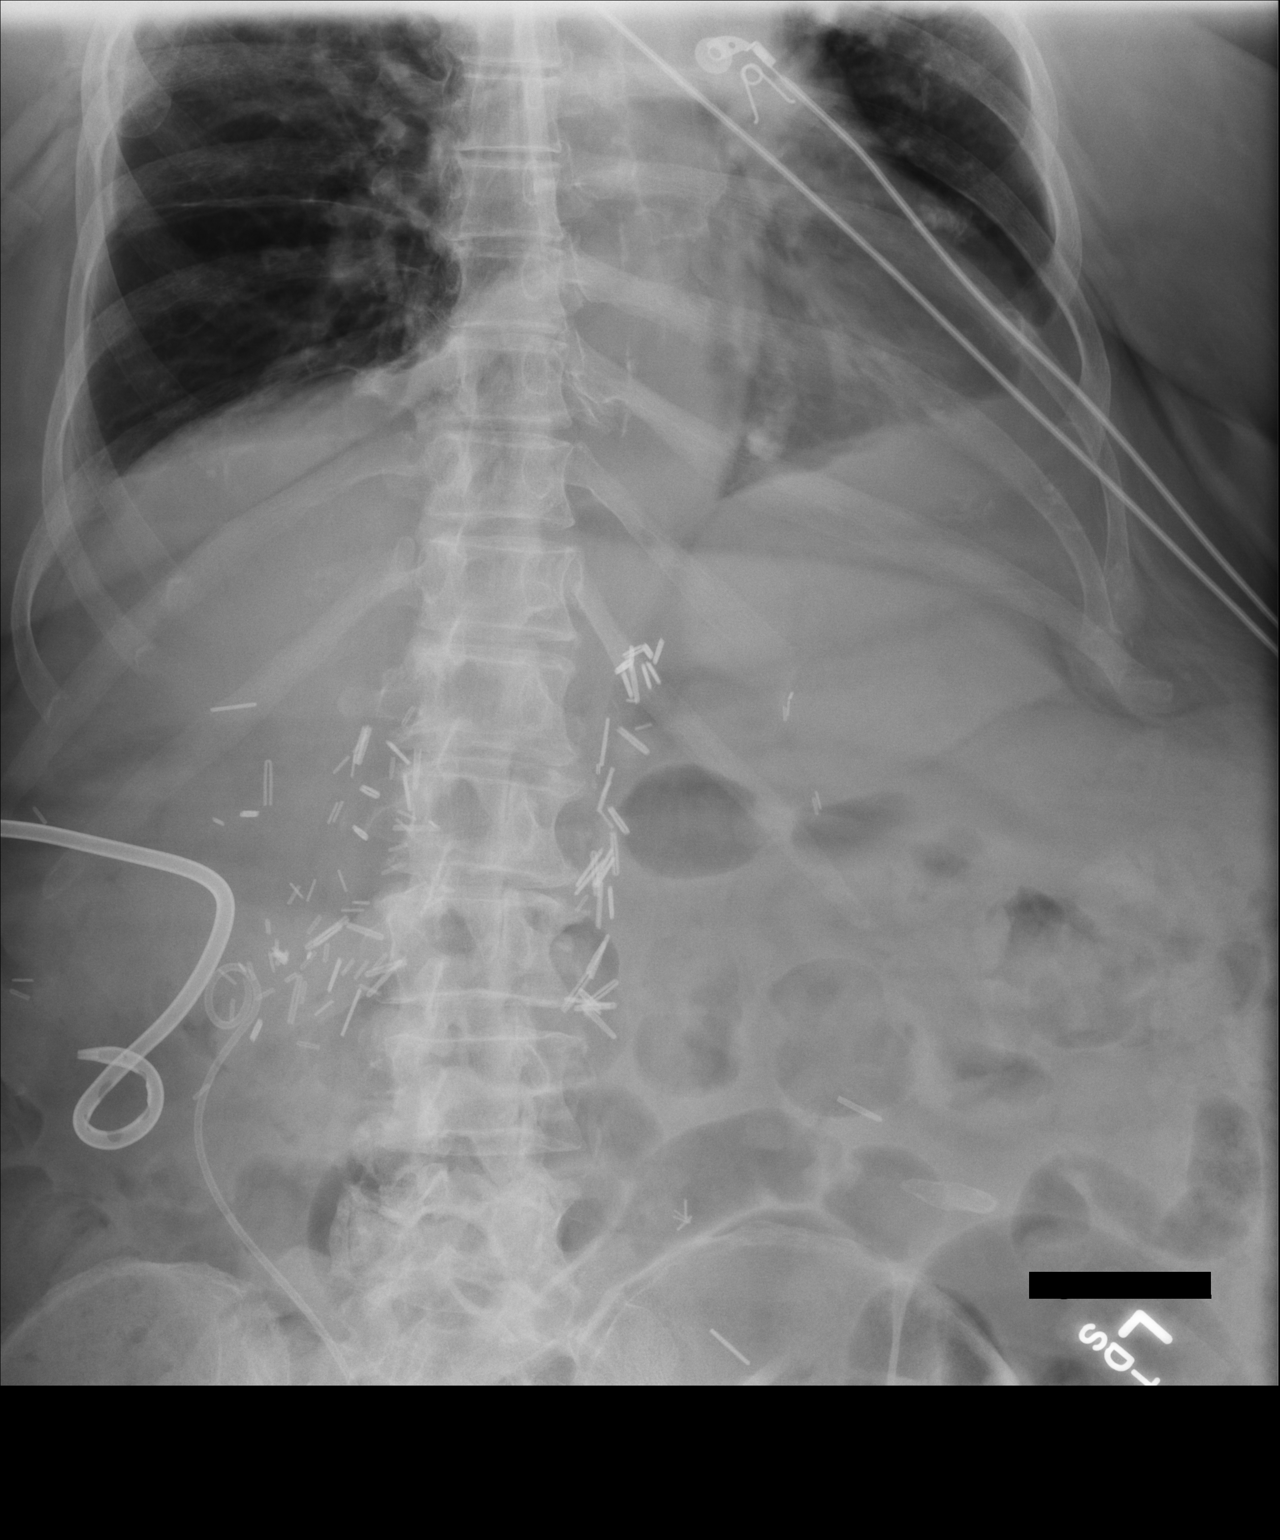

[AP (2 of 2)]
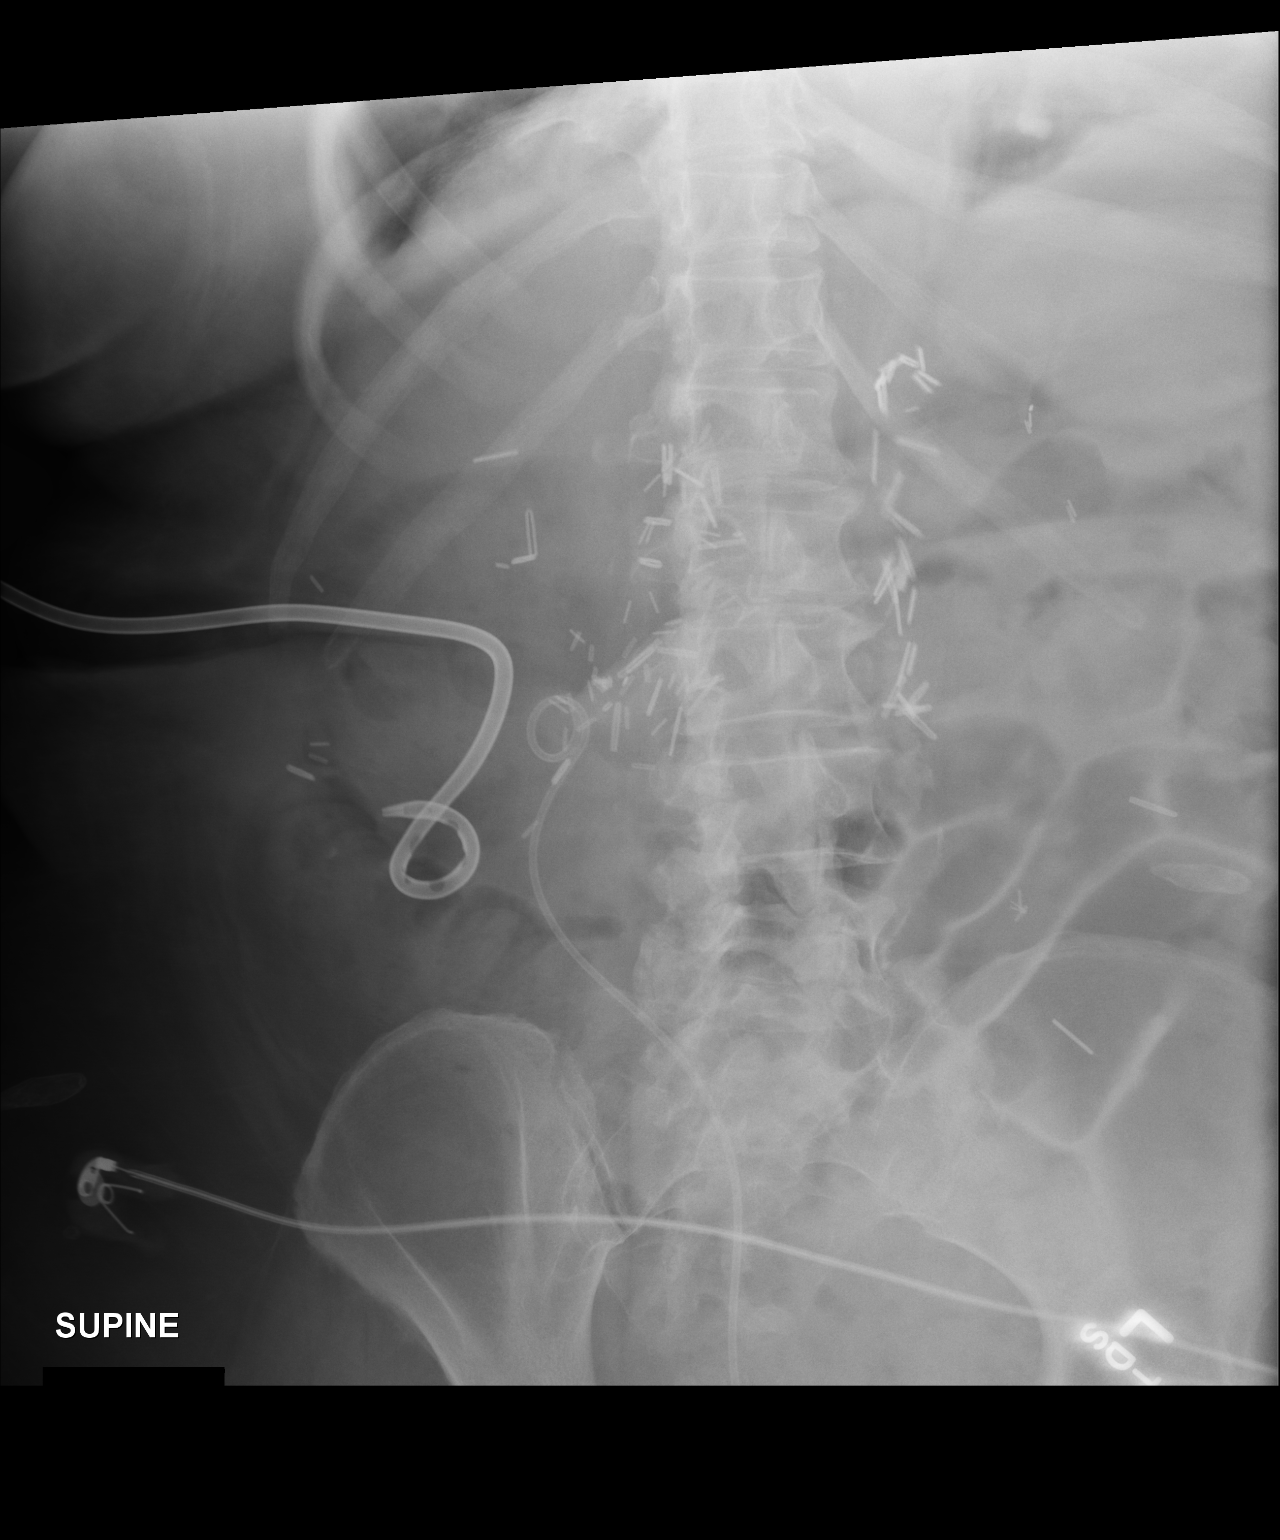

[2 of 2 positions shown; findings below may reference images not displayed]

FINDINGS: There is a double-J ureteral stent. A nephrostomy tube tip lies
inferior and lateral to this. There are numerous surgical vascular
clips in the center abdomen and right flank.

There are prominent loops of small bowel, but no convincing
obstruction.
IMPRESSION: Nephrostomy tube and ureteral stent as described. Prominent loops of
small bowel without overt obstruction.

## 2016-01-05 IMAGING — DX DG CHEST 1V PORT
1 series · 1 of 1 positions shown · non-contrast
Comparison: 01/25/2014.

CLINICAL DATA: Decreased breath.  Respiratory failure

EXAM:
PORTABLE CHEST - 1 VIEW

[portable]
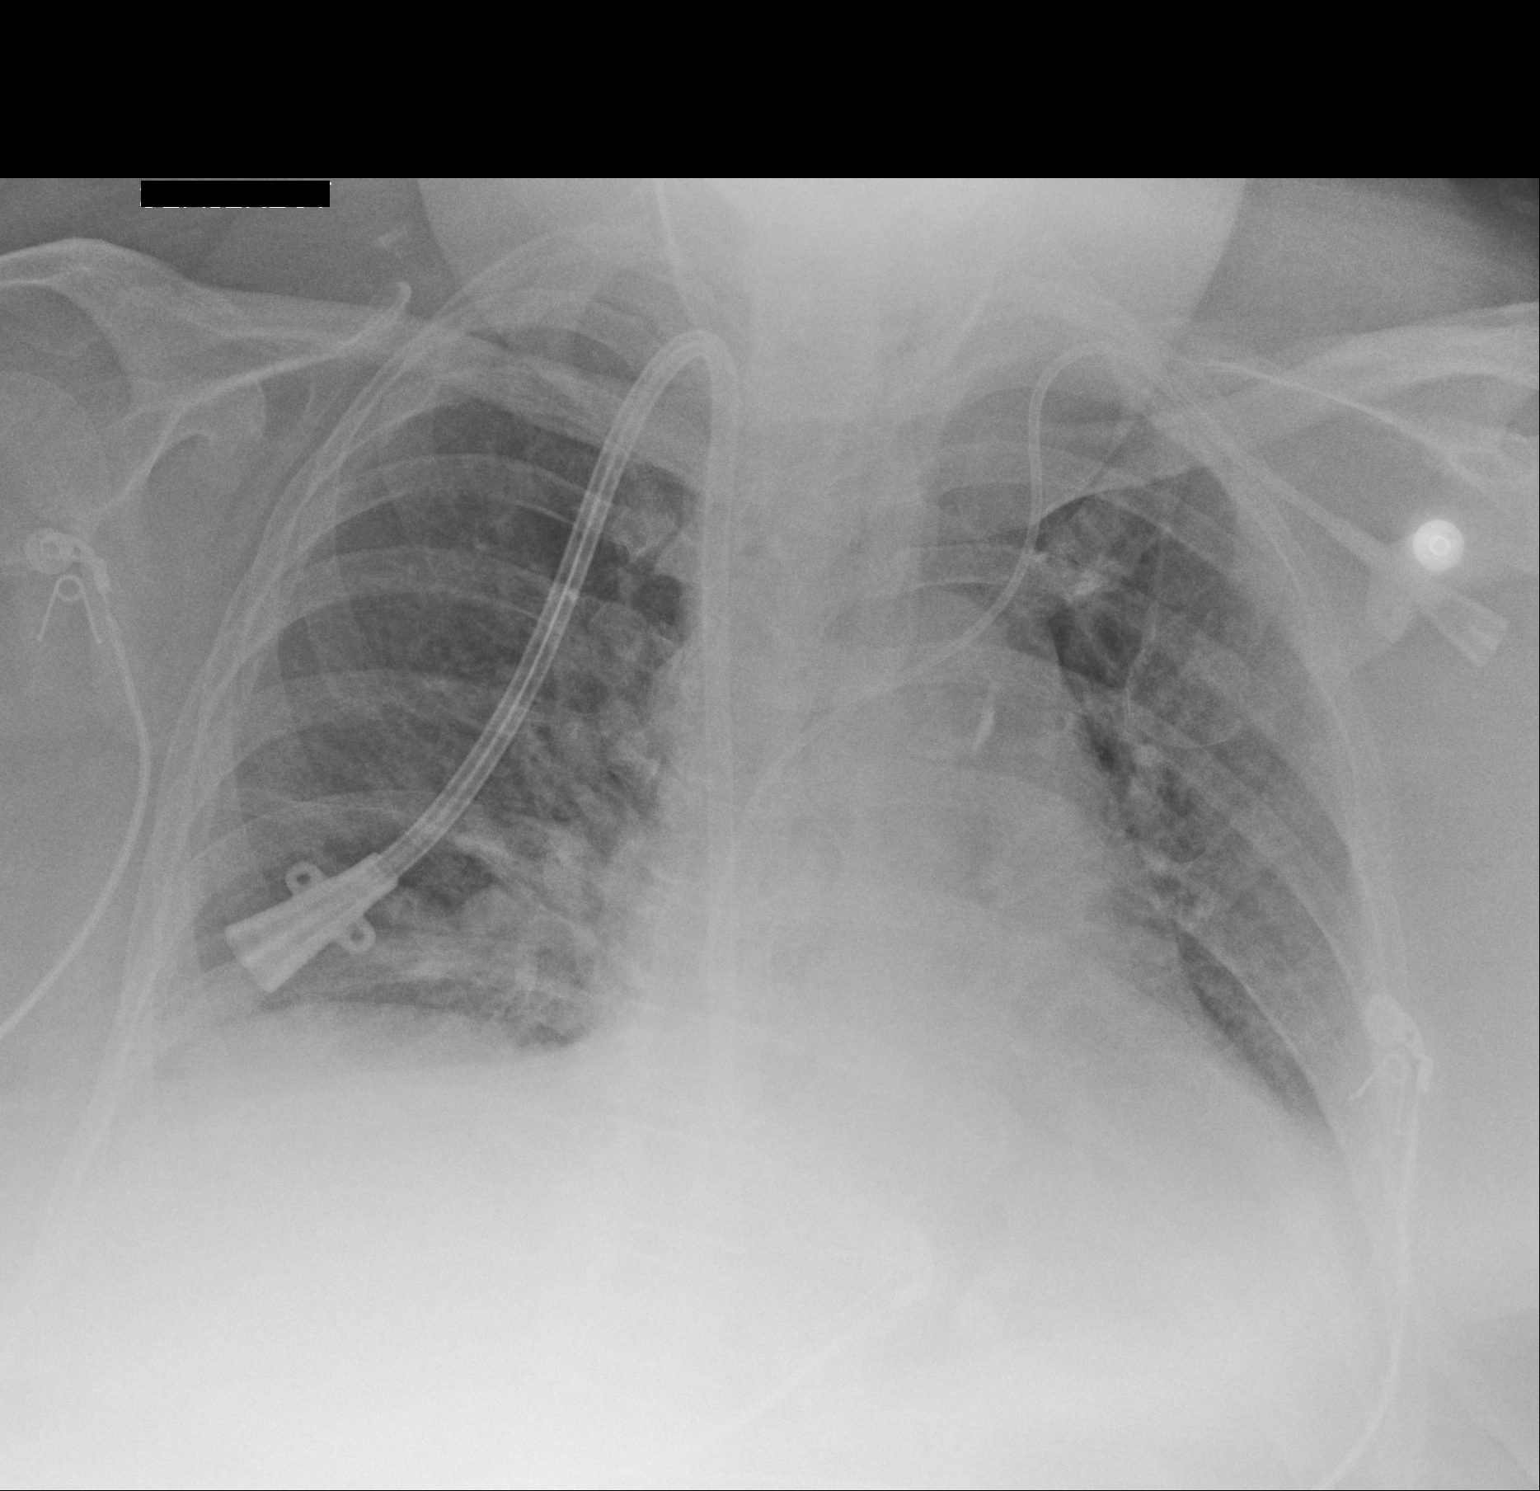

[1 of 1 positions shown; findings below may reference images not displayed]

FINDINGS: Right central line tip right atrium level.

Left central line tip distal superior vena cava/ cavoatrial junction
level.

No gross pneumothorax.

Cardiomegaly

Pulmonary vascular congestion most notable centrally.

Calcified tortuous aorta

Poor inspiration with elevated hemidiaphragms.

Left base difficult to adequately assess secondary to motion and
technique.
IMPRESSION: Cardiomegaly.

Pulmonary vascular congestion most notable centrally.

Please see above.
